# Patient Record
Sex: Male | Born: 1935 | Race: White | Hispanic: No | Marital: Married | Smoking: Never smoker
Health system: Southern US, Community
[De-identification: ages and names within clinical notes are randomized; demographics above are authoritative.]

## PROBLEM LIST (undated history)

## (undated) DIAGNOSIS — K922 Gastrointestinal hemorrhage, unspecified: Secondary | ICD-10-CM

## (undated) DIAGNOSIS — I340 Nonrheumatic mitral (valve) insufficiency: Secondary | ICD-10-CM

## (undated) DIAGNOSIS — I10 Essential (primary) hypertension: Secondary | ICD-10-CM

## (undated) DIAGNOSIS — F0390 Unspecified dementia without behavioral disturbance: Secondary | ICD-10-CM

## (undated) HISTORY — PX: BYPASS GRAFT: SHX909

## (undated) HISTORY — PX: FETAL SURGERY FOR CONGENITAL HERNIA: SHX1618

---

## 2004-02-10 ENCOUNTER — Other Ambulatory Visit: Payer: Self-pay

## 2005-04-27 ENCOUNTER — Ambulatory Visit: Payer: Self-pay | Admitting: Specialist

## 2005-08-16 ENCOUNTER — Ambulatory Visit: Payer: Self-pay | Admitting: Cardiology

## 2005-09-06 ENCOUNTER — Encounter: Payer: Self-pay | Admitting: Cardiology

## 2005-09-09 ENCOUNTER — Encounter: Payer: Self-pay | Admitting: Cardiology

## 2005-10-10 ENCOUNTER — Encounter: Payer: Self-pay | Admitting: Cardiology

## 2005-11-10 ENCOUNTER — Encounter: Payer: Self-pay | Admitting: Cardiology

## 2005-12-08 ENCOUNTER — Encounter: Payer: Self-pay | Admitting: Cardiology

## 2006-01-10 ENCOUNTER — Encounter: Payer: Self-pay | Admitting: Specialist

## 2006-02-07 ENCOUNTER — Encounter: Payer: Self-pay | Admitting: Specialist

## 2006-03-10 ENCOUNTER — Encounter: Payer: Self-pay | Admitting: Specialist

## 2006-04-11 ENCOUNTER — Ambulatory Visit: Payer: Self-pay | Admitting: Specialist

## 2006-04-13 ENCOUNTER — Encounter: Payer: Self-pay | Admitting: Specialist

## 2006-04-27 ENCOUNTER — Encounter: Payer: Self-pay | Admitting: Specialist

## 2006-05-11 ENCOUNTER — Encounter: Payer: Self-pay | Admitting: Specialist

## 2006-05-16 ENCOUNTER — Encounter: Payer: Self-pay | Admitting: Specialist

## 2006-06-13 ENCOUNTER — Encounter: Payer: Self-pay | Admitting: Specialist

## 2006-06-15 ENCOUNTER — Encounter: Payer: Self-pay | Admitting: Specialist

## 2006-07-10 ENCOUNTER — Encounter: Payer: Self-pay | Admitting: Specialist

## 2006-08-10 ENCOUNTER — Encounter: Payer: Self-pay | Admitting: Specialist

## 2006-09-21 ENCOUNTER — Encounter: Payer: Self-pay | Admitting: Specialist

## 2006-09-28 ENCOUNTER — Encounter: Payer: Self-pay | Admitting: Specialist

## 2006-10-10 ENCOUNTER — Encounter: Payer: Self-pay | Admitting: Specialist

## 2006-10-12 ENCOUNTER — Encounter: Payer: Self-pay | Admitting: Specialist

## 2006-11-14 ENCOUNTER — Encounter: Payer: Self-pay | Admitting: Specialist

## 2006-11-16 ENCOUNTER — Encounter: Payer: Self-pay | Admitting: Specialist

## 2006-12-12 ENCOUNTER — Encounter: Payer: Self-pay | Admitting: Specialist

## 2006-12-14 ENCOUNTER — Encounter: Payer: Self-pay | Admitting: Specialist

## 2007-01-09 ENCOUNTER — Encounter: Payer: Self-pay | Admitting: Specialist

## 2007-02-08 ENCOUNTER — Encounter: Payer: Self-pay | Admitting: Specialist

## 2008-04-23 ENCOUNTER — Ambulatory Visit: Payer: Self-pay | Admitting: Specialist

## 2008-05-12 ENCOUNTER — Ambulatory Visit: Payer: Self-pay | Admitting: General Surgery

## 2008-05-28 ENCOUNTER — Ambulatory Visit: Payer: Self-pay | Admitting: General Surgery

## 2008-05-28 ENCOUNTER — Other Ambulatory Visit: Payer: Self-pay

## 2008-06-04 ENCOUNTER — Ambulatory Visit: Payer: Self-pay | Admitting: General Surgery

## 2008-06-11 ENCOUNTER — Other Ambulatory Visit: Payer: Self-pay

## 2008-06-11 ENCOUNTER — Inpatient Hospital Stay: Payer: Self-pay | Admitting: Specialist

## 2008-06-19 ENCOUNTER — Ambulatory Visit: Payer: Self-pay | Admitting: Cardiology

## 2008-10-13 ENCOUNTER — Inpatient Hospital Stay: Payer: Self-pay | Admitting: Specialist

## 2009-02-26 ENCOUNTER — Ambulatory Visit: Payer: Self-pay | Admitting: Nephrology

## 2010-05-07 ENCOUNTER — Ambulatory Visit: Payer: Self-pay | Admitting: Ophthalmology

## 2010-05-17 ENCOUNTER — Ambulatory Visit: Payer: Self-pay | Admitting: Ophthalmology

## 2010-08-06 ENCOUNTER — Ambulatory Visit: Payer: Self-pay | Admitting: Ophthalmology

## 2010-08-16 ENCOUNTER — Ambulatory Visit: Payer: Self-pay | Admitting: Ophthalmology

## 2010-11-02 ENCOUNTER — Emergency Department: Payer: Self-pay | Admitting: Internal Medicine

## 2010-11-04 ENCOUNTER — Ambulatory Visit: Payer: Self-pay | Admitting: Specialist

## 2011-03-18 ENCOUNTER — Inpatient Hospital Stay: Payer: Self-pay | Admitting: Specialist

## 2011-09-07 ENCOUNTER — Emergency Department: Payer: Self-pay

## 2011-09-13 ENCOUNTER — Ambulatory Visit: Payer: Self-pay | Admitting: General Surgery

## 2011-09-19 ENCOUNTER — Ambulatory Visit: Payer: Self-pay | Admitting: General Surgery

## 2011-09-30 ENCOUNTER — Inpatient Hospital Stay: Payer: Self-pay | Admitting: Internal Medicine

## 2011-12-27 ENCOUNTER — Emergency Department: Payer: Self-pay | Admitting: Emergency Medicine

## 2012-06-14 ENCOUNTER — Emergency Department: Payer: Self-pay | Admitting: Emergency Medicine

## 2012-06-14 LAB — BASIC METABOLIC PANEL
Anion Gap: 8 (ref 7–16)
BUN: 22 mg/dL — ABNORMAL HIGH (ref 7–18)
Co2: 26 mmol/L (ref 21–32)
Creatinine: 1.31 mg/dL — ABNORMAL HIGH (ref 0.60–1.30)
Glucose: 91 mg/dL (ref 65–99)
Potassium: 3.3 mmol/L — ABNORMAL LOW (ref 3.5–5.1)
Sodium: 140 mmol/L (ref 136–145)

## 2012-06-14 LAB — CK TOTAL AND CKMB (NOT AT ARMC)
CK, Total: 71 U/L (ref 35–232)
CK-MB: 1 ng/mL (ref 0.5–3.6)

## 2012-06-14 LAB — CBC
HCT: 39.2 % — ABNORMAL LOW (ref 40.0–52.0)
HGB: 13.4 g/dL (ref 13.0–18.0)
Platelet: 184 10*3/uL (ref 150–440)
RBC: 4.28 10*6/uL — ABNORMAL LOW (ref 4.40–5.90)
WBC: 7 10*3/uL (ref 3.8–10.6)

## 2012-06-14 LAB — TROPONIN I: Troponin-I: <0.02 ng/mL

## 2013-03-06 LAB — CBC
HCT: 36.9 % — ABNORMAL LOW (ref 40.0–52.0)
MCHC: 34.2 g/dL (ref 32.0–36.0)
Platelet: 186 10*3/uL (ref 150–440)

## 2013-03-07 ENCOUNTER — Inpatient Hospital Stay: Payer: Self-pay | Admitting: Internal Medicine

## 2013-03-07 LAB — COMPREHENSIVE METABOLIC PANEL
Albumin: 3.4 g/dL (ref 3.4–5.0)
Alkaline Phosphatase: 61 U/L (ref 50–136)
Anion Gap: 7 (ref 7–16)
BUN: 28 mg/dL — ABNORMAL HIGH (ref 7–18)
Bilirubin,Total: 0.4 mg/dL (ref 0.2–1.0)
Chloride: 108 mmol/L — ABNORMAL HIGH (ref 98–107)
Co2: 25 mmol/L (ref 21–32)
Creatinine: 1.32 mg/dL — ABNORMAL HIGH (ref 0.60–1.30)
EGFR (African American): 56 — ABNORMAL LOW
EGFR (Non-African Amer.): 49 — ABNORMAL LOW
Glucose: 100 mg/dL — ABNORMAL HIGH (ref 65–99)
Osmolality: 285 (ref 275–301)
Potassium: 4 mmol/L (ref 3.5–5.1)
SGOT(AST): 32 U/L (ref 15–37)
SGPT (ALT): 16 U/L (ref 12–78)
Sodium: 140 mmol/L (ref 136–145)
Total Protein: 7 g/dL (ref 6.4–8.2)

## 2013-03-07 LAB — CBC
HCT: 31.2 % — ABNORMAL LOW (ref 40.0–52.0)
HGB: 10.7 g/dL — ABNORMAL LOW (ref 13.0–18.0)
MCHC: 34.4 g/dL (ref 32.0–36.0)
MCV: 93 fL (ref 80–100)
RBC: 3.34 10*6/uL — ABNORMAL LOW (ref 4.40–5.90)

## 2013-03-07 LAB — HEMOGLOBIN: HGB: 10.1 g/dL — ABNORMAL LOW (ref 13.0–18.0)

## 2013-03-08 LAB — CBC WITH DIFFERENTIAL/PLATELET
Basophil %: 0.5 %
Eosinophil #: 0 10*3/uL (ref 0.0–0.7)
Eosinophil %: 0 %
HCT: 28 % — ABNORMAL LOW (ref 40.0–52.0)
Lymphocyte #: 0.5 10*3/uL — ABNORMAL LOW (ref 1.0–3.6)
MCH: 32.2 pg (ref 26.0–34.0)
MCHC: 34.6 g/dL (ref 32.0–36.0)
MCV: 93 fL (ref 80–100)
Monocyte #: 0.3 x10 3/mm (ref 0.2–1.0)
Neutrophil %: 89.6 %
WBC: 7.5 10*3/uL (ref 3.8–10.6)

## 2013-03-08 LAB — HEMOGLOBIN: HGB: 9.7 g/dL — ABNORMAL LOW (ref 13.0–18.0)

## 2013-03-09 LAB — BASIC METABOLIC PANEL
Anion Gap: 9 (ref 7–16)
Chloride: 108 mmol/L — ABNORMAL HIGH (ref 98–107)
EGFR (Non-African Amer.): 59 — ABNORMAL LOW
Glucose: 117 mg/dL — ABNORMAL HIGH (ref 65–99)
Potassium: 3.4 mmol/L — ABNORMAL LOW (ref 3.5–5.1)

## 2013-03-09 LAB — MAGNESIUM: Magnesium: 1.6 mg/dL — ABNORMAL LOW

## 2013-06-28 ENCOUNTER — Emergency Department: Payer: Self-pay | Admitting: Emergency Medicine

## 2013-06-28 LAB — URINALYSIS, COMPLETE
Blood: NEGATIVE
Nitrite: NEGATIVE
Protein: >=500
RBC,UR: 1 /HPF (ref 0–5)
Squamous Epithelial: NONE SEEN

## 2013-06-28 LAB — COMPREHENSIVE METABOLIC PANEL
Alkaline Phosphatase: 62 U/L (ref 50–136)
Calcium, Total: 9.2 mg/dL (ref 8.5–10.1)
Co2: 28 mmol/L (ref 21–32)
Potassium: 3.2 mmol/L — ABNORMAL LOW (ref 3.5–5.1)
SGPT (ALT): 15 U/L (ref 12–78)

## 2013-06-28 LAB — CBC
HGB: 10.7 g/dL — ABNORMAL LOW (ref 13.0–18.0)
MCH: 26.4 pg (ref 26.0–34.0)
MCHC: 32.4 g/dL (ref 32.0–36.0)
MCV: 82 fL (ref 80–100)
Platelet: 194 10*3/uL (ref 150–440)
RBC: 4.07 10*6/uL — ABNORMAL LOW (ref 4.40–5.90)
RDW: 17.3 % — ABNORMAL HIGH (ref 11.5–14.5)

## 2013-06-28 LAB — TROPONIN I: Troponin-I: <0.02 ng/mL

## 2013-09-21 ENCOUNTER — Emergency Department: Payer: Self-pay | Admitting: Emergency Medicine

## 2013-09-21 LAB — COMPREHENSIVE METABOLIC PANEL
Albumin: 3.7 g/dL (ref 3.4–5.0)
Anion Gap: 6 — ABNORMAL LOW (ref 7–16)
Bilirubin,Total: 0.4 mg/dL (ref 0.2–1.0)
Calcium, Total: 9.3 mg/dL (ref 8.5–10.1)
Chloride: 107 mmol/L (ref 98–107)
Osmolality: 284 (ref 275–301)
Potassium: 3.5 mmol/L (ref 3.5–5.1)
Total Protein: 7.6 g/dL (ref 6.4–8.2)

## 2013-09-21 LAB — CBC
MCH: 27.1 pg (ref 26.0–34.0)
MCHC: 32.3 g/dL (ref 32.0–36.0)
MCV: 84 fL (ref 80–100)
Platelet: 237 10*3/uL (ref 150–440)
RBC: 4.13 10*6/uL — ABNORMAL LOW (ref 4.40–5.90)
WBC: 7.2 10*3/uL (ref 3.8–10.6)

## 2014-04-14 ENCOUNTER — Emergency Department: Payer: Self-pay | Admitting: Emergency Medicine

## 2014-04-14 LAB — COMPREHENSIVE METABOLIC PANEL
ALBUMIN: 3.5 g/dL (ref 3.4–5.0)
Alkaline Phosphatase: 69 U/L
Anion Gap: 8 (ref 7–16)
BUN: 26 mg/dL — AB (ref 7–18)
Bilirubin,Total: 0.8 mg/dL (ref 0.2–1.0)
CALCIUM: 8.9 mg/dL (ref 8.5–10.1)
CO2: 25 mmol/L (ref 21–32)
Chloride: 110 mmol/L — ABNORMAL HIGH (ref 98–107)
Creatinine: 1.43 mg/dL — ABNORMAL HIGH (ref 0.60–1.30)
EGFR (Non-African Amer.): 44 — ABNORMAL LOW
GFR CALC AF AMER: 51 — AB
Glucose: 90 mg/dL (ref 65–99)
Osmolality: 289 (ref 275–301)
Potassium: 3.8 mmol/L (ref 3.5–5.1)
SGOT(AST): 25 U/L (ref 15–37)
SGPT (ALT): 15 U/L (ref 12–78)
Sodium: 143 mmol/L (ref 136–145)
TOTAL PROTEIN: 7.7 g/dL (ref 6.4–8.2)

## 2014-04-14 LAB — CBC
HCT: 37.8 % — ABNORMAL LOW (ref 40.0–52.0)
HGB: 12.2 g/dL — ABNORMAL LOW (ref 13.0–18.0)
MCH: 29.5 pg (ref 26.0–34.0)
MCHC: 32.3 g/dL (ref 32.0–36.0)
MCV: 91 fL (ref 80–100)
PLATELETS: 210 10*3/uL (ref 150–440)
RBC: 4.14 10*6/uL — ABNORMAL LOW (ref 4.40–5.90)
RDW: 16.9 % — AB (ref 11.5–14.5)
WBC: 9.3 10*3/uL (ref 3.8–10.6)

## 2014-04-14 LAB — TROPONIN I: Troponin-I: <0.02 ng/mL

## 2014-10-01 LAB — URINALYSIS, COMPLETE
BACTERIA: NONE SEEN
BLOOD: NEGATIVE
Bilirubin,UR: NEGATIVE
GLUCOSE, UR: NEGATIVE mg/dL (ref 0–75)
LEUKOCYTE ESTERASE: NEGATIVE
NITRITE: NEGATIVE
PH: 7 (ref 4.5–8.0)
Protein: 100
Renal Epithelial: 13
Specific Gravity: 1.013 (ref 1.003–1.030)
Squamous Epithelial: NONE SEEN
WBC UR: <1 /HPF (ref 0–5)

## 2014-10-01 LAB — BASIC METABOLIC PANEL
Anion Gap: 11 (ref 7–16)
BUN: 22 mg/dL — AB (ref 7–18)
CALCIUM: 9.1 mg/dL (ref 8.5–10.1)
CHLORIDE: 107 mmol/L (ref 98–107)
Co2: 24 mmol/L (ref 21–32)
Creatinine: 1.36 mg/dL — ABNORMAL HIGH (ref 0.60–1.30)
EGFR (Non-African Amer.): 53 — ABNORMAL LOW
Glucose: 157 mg/dL — ABNORMAL HIGH (ref 65–99)
OSMOLALITY: 290 (ref 275–301)
Potassium: 2.9 mmol/L — ABNORMAL LOW (ref 3.5–5.1)
Sodium: 142 mmol/L (ref 136–145)

## 2014-10-01 LAB — TROPONIN I: Troponin-I: <0.02 ng/mL

## 2014-10-01 LAB — CBC
HCT: 46 % (ref 40.0–52.0)
HGB: 14.8 g/dL (ref 13.0–18.0)
MCH: 30.5 pg (ref 26.0–34.0)
MCHC: 32.3 g/dL (ref 32.0–36.0)
MCV: 94 fL (ref 80–100)
Platelet: 225 10*3/uL (ref 150–440)
RBC: 4.87 10*6/uL (ref 4.40–5.90)
RDW: 15.9 % — AB (ref 11.5–14.5)
WBC: 10.5 10*3/uL (ref 3.8–10.6)

## 2014-10-02 ENCOUNTER — Inpatient Hospital Stay: Payer: Self-pay | Admitting: Internal Medicine

## 2014-10-02 LAB — BASIC METABOLIC PANEL
ANION GAP: 10 (ref 7–16)
BUN: 23 mg/dL — ABNORMAL HIGH (ref 7–18)
Calcium, Total: 8.9 mg/dL (ref 8.5–10.1)
Chloride: 107 mmol/L (ref 98–107)
Co2: 26 mmol/L (ref 21–32)
Creatinine: 1.46 mg/dL — ABNORMAL HIGH (ref 0.60–1.30)
EGFR (African American): 59 — ABNORMAL LOW
EGFR (Non-African Amer.): 48 — ABNORMAL LOW
GLUCOSE: 117 mg/dL — AB (ref 65–99)
Osmolality: 290 (ref 275–301)
Potassium: 3.4 mmol/L — ABNORMAL LOW (ref 3.5–5.1)
Sodium: 143 mmol/L (ref 136–145)

## 2014-10-02 LAB — CK-MB
CK-MB: 1.4 ng/mL (ref 0.5–3.6)
CK-MB: 2.1 ng/mL (ref 0.5–3.6)
CK-MB: 2.7 ng/mL (ref 0.5–3.6)

## 2014-10-02 LAB — MAGNESIUM: MAGNESIUM: 2 mg/dL

## 2014-10-02 LAB — HEMOGLOBIN A1C: HEMOGLOBIN A1C: 5.6 % (ref 4.2–6.3)

## 2014-10-03 LAB — COMPREHENSIVE METABOLIC PANEL
ALBUMIN: 3.5 g/dL (ref 3.4–5.0)
ALT: 15 U/L
AST: 30 U/L (ref 15–37)
Alkaline Phosphatase: 78 U/L
Anion Gap: 10 (ref 7–16)
BILIRUBIN TOTAL: 0.8 mg/dL (ref 0.2–1.0)
BUN: 27 mg/dL — ABNORMAL HIGH (ref 7–18)
CALCIUM: 9 mg/dL (ref 8.5–10.1)
CHLORIDE: 106 mmol/L (ref 98–107)
Co2: 27 mmol/L (ref 21–32)
Creatinine: 1.66 mg/dL — ABNORMAL HIGH (ref 0.60–1.30)
EGFR (African American): 51 — ABNORMAL LOW
GFR CALC NON AF AMER: 42 — AB
GLUCOSE: 116 mg/dL — AB (ref 65–99)
OSMOLALITY: 291 (ref 275–301)
Potassium: 3.2 mmol/L — ABNORMAL LOW (ref 3.5–5.1)
SODIUM: 143 mmol/L (ref 136–145)
TOTAL PROTEIN: 7.4 g/dL (ref 6.4–8.2)

## 2015-01-30 NOTE — Consult Note (Signed)
Brief Consult Note: Diagnosis: Recurrent lower GI bleed.   Patient was seen by consultant.   Consult note dictated.   Comments: Mr. Gregory Oconnor is a pleasant 81 y/o male admitted with a large episode of bright red blood with dark clots.  Hgb 10.1.  No transfusion required at this time.  No further blleeding since hospitalization.Hx of diverticular bleeding requiring multiple transfusions in Dec 2012 & colonoscopy showed extensive diverticulosis by Dr Bluford Kaufmannh.  I have discussed his care with Dr Servando SnareWohl.    If he were to rebleed, would suggest bleeding scan.   If positive for diverticular bleed, would consult vascular surgeon for possible angiography & embolization.  At this point, would continue supportive measures.   Plann: 1) Follow-up with renal CT given abnormal right kidney lesion per attending 2) if rebleeds, bleeding scan.  Followed by vascular surgery consult if diverticular. 3) continue IV fluids, supportive measures and rest #[id number].  Electronic Signatures: Joselyn ArrowJones, Rosaleigh Brazzel L (NP)  (Signed 29-May-14 10:53)  Authored: Brief Consult Note   Last Updated: 29-May-14 10:53 by Joselyn ArrowJones, Tomeka Kantner L (NP)

## 2015-01-30 NOTE — Consult Note (Signed)
PATIENT NAME:  Gregory Oconnor, Gregory Oconnor MR#:  [id number] DATE OF BIRTH:  April 20, 1924  DATE OF CONSULTATION:  03/07/2013  REFERRING PHYSICIAN:  Dr. Heron NayVasireddy  PRIMARY CARE PHYSICIAN: Dr. Leavy CellaBlocker.  GASTROENTEROLOGIST: Dr. Servando SnareWohl.   REASON FOR CONSULTATION: Lower GI bleed.  HISTORY OF PRESENT ILLNESS: Gregory Oconnor is an 81 year old Caucasian male with multiple medical problems including advanced dementia, hypertension, coronary artery disease and diverticulosis, who presents with large volume hematochezia that started last night. His wife reports around 9:00 p.m. he went to the bathroom and had a large amount of bright red blood with a small amount of stool and black blood clots. He has had no further bleeding since that episode. He denies any abdominal pain. He does have advanced dementia and his wife gives most of the history. She reports he has had no complaints of abdominal pain except rare complains of heartburn. She states that he was in his usual state of health until this episode yesterday and has had no further bleeding since hospitalization. He has not had any nausea or vomiting. He did have a lower GI bleed back in December 2012. He had a colonoscopy by Dr. Bluford Kaufmannh that showed extensive diverticulosis throughout the colon. He required multiple transfusions for suspected diverticular bleed at that time. His hemoglobin was 12.6 upon admission and is down to 10.1 today with IV fluids. He is on aspirin daily at home. His creatinine is 1.32. He had a CT of abdomen and pelvis with contrast on 03/07/2013, which showed sigmoid diverticulosis and a small right inguinal hernia with a contained loop of bowel and a small right renal lesion that has increased in size. His wife denies any over-the-counter NSAIDs.   PAST MEDICAL/SURGICAL HISTORY: Diverticulosis with diverticular bleed December 2012; last colonoscopy as per HPI, advanced dementia, hypertension, BPH, coronary artery disease, status post CABG, status post  pacemaker placement and multiple hernia repairs.   MEDICATIONS PRIOR TO ADMISSION: Amlodipine 5 mg daily, Aricept 10 mg at bedtime, aspirin 81 mg daily, Avodart 0.5 mg Monday, Wednesday, Friday, Claritin 10 mg daily, gabapentin 100 mg p.r.n., isosorbide 120 mg extended release daily, lisinopril 5 mg daily, Nexium 20 mg daily, nitroglycerin 0.4 mg sublingual p.r.n., vitamin B12 1000 mcg daily.   ALLERGIES: CEPHALEXIN, PENICILLIN AND CLARITHROMYCIN.   FAMILY HISTORY: There is no known family history of colorectal carcinoma reported from wife. However, there is some notation in the record that there was a positive family history of colon cancer. I am not getting this report today. Otherwise noncontributory.   SOCIAL HISTORY: No recent tobacco, alcohol or drug use. He is married. He lives with his wife. He is a retired Airline pilotaccountant.   REVIEW OF SYSTEMS: Unable to obtain from patient. Most of the information is obtained from his wife. See history of present illness, otherwise negative review of systems.   PHYSICAL EXAMINATION:  VITAL SIGNS: Temperature 97.4, pulse 68, respirations 28, blood pressure 128/83, oxygen saturation 94% on room air.  GENERAL: He is an elderly, pale-appearing, Caucasian male who is alert, but disoriented and cooperative. He wife is at the bedside.  HEENT: Sclerae clear and anicteric. Conjunctivae pink. Oropharynx pink and moist without any lesions.  NECK: Supple without any mass or thyromegaly.  CHEST: Heart regular rate and rhythm. Normal S1, S2, without murmurs, clicks, rubs or gallops.  LUNGS: Clear to auscultation bilaterally.  ABDOMEN: Positive bowel sounds x 4. No bruits auscultated. Abdomen is soft, nontender, nondistended without palpable mass or splenomegaly. No tenderness or guarding.  EXTREMITIES: Without  edema bilaterally.  RECTAL: Deferred.  SKIN: No rash or lesions. No jaundice.  NEUROLOGIC: He is alert, but disoriented x 2.  PSYCHIATRIC: He is cooperative.  Normal mood and affect.   LABORATORY STUDIES: 03/06/2013, glucose 100, BUN 28, creatinine 1.32, chloride 108, otherwise normal BMP. LFTs normal. White blood cell count 8.3, platelets 170.   IMPRESSION: Gregory Oconnor is a pleasant 81 year old male admitted with a large episode of bright red blood with dark clots last night. His hemoglobin is 10.1. No transfusion required at this time. He has had no further bleeding since hospitalization. He has a history of diverticular bleeding requiring multiple transfusions December 2012 and colonoscopy showed extensive diverticulosis by Dr. Bluford Kaufmann. I have discussed his care with Dr. Servando Snare. If he were to re-bleed, would suggest bleeding scan. If positive for diverticular bleed, would consult vascular surgeon for possible angiography and embolization. At this point, would continue supportive measures.   PLAN:  1.  Follow up with renal CT given abnormal right kidney lesion per attending.  2.  If re-bleeds, bleeding scan followed by vascular surgery consult.  3.  Continue IV fluids, supportive measures and rest.   ____________________________ Joselyn Arrow, NP klj:aw D: 03/07/2013 10:49:03 ET T: 03/07/2013 11:02:32 ET JOB#: [id number]  cc: Joselyn Arrow, NP, <Dictator> Rosalyn Gess. Blocker, MD Joselyn Arrow FNP ELECTRONICALLY SIGNED 03/13/2013 14:08

## 2015-01-30 NOTE — Consult Note (Signed)
Chief Complaint:  Subjective/Chief Complaint No further bleeding per RN.  Pt unable to answer questions.  Pleasantly confused.   VITAL SIGNS/ANCILLARY NOTES: **Vital Signs.:   30-May-14 08:19  Vital Signs Type Routine  Temperature Temperature (F) 98.9  Celsius 37.1  Temperature Source axillary    10:30  Pulse Pulse 74  Respirations Respirations 19  Systolic BP Systolic BP 154  Diastolic BP (mmHg) Diastolic BP (mmHg) 83  Mean BP 106  Pulse Ox % Pulse Ox % 96  Pulse Ox Activity Level  At rest  Oxygen Delivery Room Air/ 21 %   Brief Assessment:  GEN well developed, well nourished, no acute distress, Pale   Cardiac Regular   Respiratory normal resp effort   Gastrointestinal Normal   Gastrointestinal details normal Soft  Nondistended  No masses palpable  Bowel sounds normal   EXTR negative edema   Additional Physical Exam Skin: Pale, warm,dry   Lab Results: Routine Hem:  30-May-14 00:49   WBC (CBC) 7.5  RBC (CBC)  3.00  Hemoglobin (CBC)  9.7  Hematocrit (CBC)  28.0  Platelet Count (CBC) 156  MCV 93  MCH 32.2  MCHC 34.6  RDW 14.3  Neutrophil % 89.6  Lymphocyte % 6.4  Monocyte % 3.5  Eosinophil % 0.0  Basophil % 0.5  Neutrophil #  6.7  Lymphocyte #  0.5  Monocyte # 0.3  Eosinophil # 0.0  Basophil # 0.0 (Result(s) reported on 08 Mar 2013 at 01:16AM.)   Assessment/Plan:  Assessment/Plan:  Assessment Large volume Hematochezia:  No further bleeding.  Likely diverticular bleeding Anemia: Hgb minimally trending down, will follow.   Plan 1) if rebleeds, bleeding scan.  Followed by vascular surgery consult if diverticular. 2) continue IV fluids, supportive measures and rest   Electronic Signatures: Joselyn ArrowJones, Tiny Chaudhary L (NP)  (Signed 30-May-14 12:08)  Authored: Chief Complaint, VITAL SIGNS/ANCILLARY NOTES, Brief Assessment, Lab Results, Assessment/Plan   Last Updated: 30-May-14 12:08 by Joselyn ArrowJones, Tove Wideman L (NP)

## 2015-01-30 NOTE — H&P (Signed)
PATIENT NAME:  Gregory Oconnor, Gregory Oconnor MR#:  [id number] DATE OF BIRTH:  03/26/1924  DATE OF ADMISSION:  03/07/2013  PRIMARY CARE PHYSICIAN:  Dr. Leavy CellaBlocker.   REFERRING PHYSICIAN:  Bayard Malesandolph Brown, M.D.   CHIEF COMPLAINT:  Bright red blood per rectum.   HISTORY OF PRESENT ILLNESS:  Mr. Gregory Oconnor is an 81 year old with advanced dementia, diverticulosis, hypertension with a previous history of GI bleed from diverticulosis, comes to the Emergency Department with complaints of bright red blood per rectum around 9:00 p.m.  The patient is doing well, in his usual state of health.  Had a large bowel movement with blood all over the bathroom toilet and his clothes.  Considering this, the patient was brought to the Emergency Department.  After coming to the Emergency Department, the patient had another episode of bowel movement.  The patient's initial hemoglobin was 12.6, however after having the second bowel movement as well as giving the IV fluids, the patient's dropped to 10.7.  The patient was initially tachycardic, improved with IV fluids.  Denied having any abdominal pain.  The patient was admitted in 2012 with a similar condition.  At that time, the patient had a colonoscopy done which revealed multiple small and large open-mouthed diverticula.  The patient continued to drop hemoglobin during that time, required multiple transfusions.   PAST MEDICAL HISTORY: 1.  Advanced dementia.  2.  Hypertension.  3.  Diverticulosis with previous history of bleed.  4.  Benign prostatic hypertrophy.  5.  Coronary artery disease, status post CABG.  6.  Status post pacemaker placement. 7.  History of hernia repair.  ALLERGIES:  1.  CLARITHROMYCIN.  2.  CEPHALEXIN.  3.  PENICILLIN.   HOME MEDICATIONS:  1.  Vitamin B 12,000 mcg once a day.  2.  Nitroglycerin 0.4  3.  Lisinopril 5 mg once a day.  4.  Imdur 120 mg 1 tablet once a day.  5.  Gabapentin 100 mg as needed.  6.  Claritin 10 mg once a day.  7.  Avodart  0.5 mg Monday, Wednesday, Friday.  8.  Aspirin 81 mg daily.  9.   10 mg once a day.  10.  Amlodipine 5 mg once a day.   SOCIAL HISTORY:  No history of smoking, drinking alcohol or using illicit drugs.  Married, lives with his wife, who have been married for the last 14 years.   FAMILY HISTORY:  Noncontributory, however the previous records indicate that positive for malignancy.   REVIEW OF SYSTEMS:  Could not be obtained from the patient secondary to patient's advanced dementia.   PHYSICAL EXAMINATION: GENERAL:  This is a well-built, well-nourished, age-appropriate male lying down in the bed, confused.  VITAL SIGNS:  Temperature 98.1, pulse 82, blood pressure 142/93, respiratory rate of 18, oxygen saturations 95% on room air.  HEENT:  Head normocephalic, atraumatic.  Eyes, no sclerae icterus.  Conjunctivae normal.  Pupils equal and react to light.  Extraocular movements are intact.  Mucous membranes moist.  No pharyngeal erythema.  NECK:  Supple.  No lymphadenopathy.  No JVD.  No carotid bruit.  CHEST:  Has no focal tenderness.  LUNGS:  Bilaterally clear to auscultation.  HEART:  S1, S2, regular, no murmurs are heard.  No pedal edema.  Pulses 2+ in posterior tibialis.  ABDOMEN:  Bowel sounds plus.  Soft, nontender, nondistended.  No hepatosplenomegaly.  SKIN:  No rash or lesions.   RECTAL:  Stool occult guaiac-positive, strongly positive.  LYMPHATIC:  No inguinal and  axillary lymphadenopathy.  MUSCULOSKELETAL:  Good range of motion in all the extremities.  NEUROLOGIC:  The patient is alert, oriented to self and wife, but not to place and time.  No apparent cranial nerve abnormalities.  Moving all four extremities.  Per patient's wife, patient is confused at baseline, recognizes only the family members whom he sees on a regular basis.   LABORATORY DATA:  CBC, WBC of 8.3, initial hemoglobin was 12.6, tended down to 10.7.  CMP, BUN 28, creatinine of 1.8.  The rest all the values are within  normal limits.   ASSESSMENT AND PLAN:  Gregory Oconnor is an 81 year old with known history of diverticulosis, comes to the Emergency Department with bright red blood per rectum.  1.  Bright red blood per rectum.  This is unlikely upper gastrointestinal bleed.  The patient is hemodynamically stable.  We will admit the patient to the stepdown unit of the CCU.  Continue to follow up with vital signs.  We will continue with IV fluids.  Type and crossmatch 2 units of packed RBC.  We will transfuse for hemoglobin less than 8 or if patient continues to have bloody bowel movements.  We will hold the aspirin.  2.  Hypertension.  Currently well-controlled.  We will hold all other blood pressure medications except for Imdur and lisinopril.  3.  Anemia secondary to acute blood loss.  We will continue to follow up.  4.  Advanced dementia.  We will hold all medications.  5.  Keep the patient on deep vein thrombosis prophylaxis with SCDs.  Time spent 40 min.   ____________________________ Susa Griffins, MD pv:ea D: 03/07/2013 16:10:96 ET T: 03/07/2013 06:56:51 ET JOB#: [id number]  cc: Susa Griffins, MD, <Dictator> Susa Griffins MD ELECTRONICALLY SIGNED 03/08/2013 7:33

## 2015-01-30 NOTE — Discharge Summary (Signed)
PATIENT NAME:  Gregory Oconnor, Gregory Oconnor MR#:  [id number] DATE OF BIRTH:  [birth year]  DATE OF ADMISSION:  03/07/2013 DATE OF DISCHARGE:  03/10/2013  PRESENTING COMPLAINT: Rectal bleed.  DISCHARGE DIAGNOSIS:  1.  Lower gastrointestinal bleed, suspect diverticular, resolved.  2.  Anemia due to gastrointestinal bleed.  3.  Advanced dementia.  4.  Hypertension.   CODE STATUS: Full code.   MEDICATIONS:  1.  Imdur 120 mg extended release p.o. daily.  2.  Aricept 10 mg at bedtime.  3.  Amlodipine 5 mg daily.  4.  Avodart 0.5 mg Monday, Wednesday, Friday.  5.  Vitamin B12 1000 mcg p.o. daily.  6.  Nexium 20 mg delayed release p.o. daily.  7.  Claritin 10 mg daily.  8.  Lisinopril 5 mg daily.  9.  Aspirin 81 mg daily.  10. Gabapentin  mg daily as needed.  11. Haldol 1 mg 1 time a day as needed for agitation.   DIET: High fiber diet.   FOLLOWUP: With Dr. Leavy CellaBlocker your primary care physician and follow up with Dr. Daleen SquibbWall, GI as needed.   Hemoglobin at discharge was 9.6, creatinine is 1.12, sodium 143, potassium 3.4, chloride 108 and bicarbonate is 26. CT of the abdomen and pelvis showed no acute findings in the abdomen or pelvis. There is diverticulosis of the sigmoid colon and small right inguinal hernia containing a loop of bowel. GI consultation with Dr. Daleen SquibbWall.   BRIEF SUMMARY OF HOSPITAL COURSE: Mr. Gregory Oconnor is an 81 year old Caucasian gentleman with known history of diverticulosis with previous history of diverticular bleed, hypertension, CAD,  status post CABG and advanced dementia, who comes in with bright red blood per rectum. He was admitted with:  1.  Hematochezia/lower GI bleed, which was suspected to be recurrent diverticular bleed. The patient was started on IV fluids and initially kept n.p.o. His hemoglobin remained stable. He did not require any blood transfusion. It dropped 2 grams from his previous hemoglobin and remained at 9.7 prior to discharge. Clear liquid diet was started and  advanced to a soft diet as tolerated. The patient was seen by Dr. Daleen SquibbWall and no further recommendations were given at this time. Wife was explained to follow up with Dr. Daleen SquibbWall as needed.  2.  Hypertension. The patient was placed on his home meds, which is ACE inhibitors, amlodipine and Imdur.  3.  Advanced dementia with intermittent agitation. P.r.n. Haldol was prescribed to take home.  4.  History of CAD, status post CABG. We did hold aspirin in the setting of GI bleed. The rest of his cardiac meds were continued. Hospital stay otherwise remained stable. The patient remained full code.   TIME SPENT: 40 minutes.   ____________________________ Wylie HailSona A. Allena KatzPatel, MD sap:aw D: 03/11/2013 06:45:06 ET T: 03/11/2013 09:02:56 ET JOB#: [id number]  cc: Kiari Hosmer A. Allena KatzPatel, MD, <Dictator> Rosalyn GessMichael E. Blocker, MD DR. WALL (2 IN SYSTEM, NO FIRST NAME GIVEN) Baldemar Dady A Chennel Olivos MD ELECTRONICALLY SIGNED 03/14/2013 19:37

## 2015-01-31 NOTE — H&P (Signed)
PATIENT NAME:  Gregory Oconnor, Gregory Oconnor MR#:  [id number] DATE OF BIRTH:  Aug 14, 1924  DATE OF ADMISSION:  10/02/2014  REFERRING DOCTOR: Lurena Joiner L. Shaune Pollack, MD  PRIMARY CARE DOCTOR: Stann Mainland. Sampson Goon, MD, of [facility].  CHIEF COMPLAINT: Altered sensorium with  generalized weakness and unresponsiveness with slumping to the side.    HISTORY OF PRESENT ILLNESS: An 80 year old Caucasian male with past medical history significant for Alzheimer dementia, history of hypertension, myocardial infarction status post CABG, status post permanent pacemaker, history of GI bleeds in the past, benign prostatic hypertrophy was in his usual state of health until last night when he was sitting on his couch, he was found to be with altered sensorium with decreased responsiveness, hence his wife called  EMS who found the patient with altered sensorium and was unresponsive on arrival and was also  noted to be slumped to one side with weakness and was brought to the Emergency Room by EMS for further evaluation. On arrival to the Emergency Room, the patient was still with altered sensorium with unresponsiveness but gradually his unresponsiveness improved and he became more alert and awake as time progressed in the Emergency Room. According to the patient's wife, who is sitting with the patient at this time, the patient was doing fine up until last night when he developed the above mentioned symptoms all of a sudden. The patient regained his responsiveness and, according to the patient's wife, he is at his baseline at the current time. There was no history of any chest pain, shortness of breath prior to the event. No history of any recent fever, cough, shortness of breath, palpitations. No history of any nausea, vomiting, diarrhea, or urinary symptoms, as per the patient's wife. In the Emergency Room, the patient was evaluated by the ED physician and his lab work was essentially unremarkable except for a potassium of 2.9 and BUN and  creatinine significant for 22/1.36. A chest x-ray was unremarkable for any acute pathology and CT of the head, noncontrast study, was also negative for any acute intracranial pathology. The hospitalist service was called and consulted for further evaluation and management. At the current time, the patient is alert, awake, and following some commands. Denies any complaints at this time and comfortably resting in the bed and moving all his extremities well.   PAST MEDICAL HISTORY:  1.  Alzheimer dementia.  2.  Hypertension.  3.  History of myocardial infarction status post CABG.  4.  Status post permanent pacemaker.  5.  History of gastrointestinal bleeds x 2.  6.  History of benign prostatic hypertrophy.  7.  History of skin cancer.    PAST SURGICAL HISTORY:  1.  Status post CABG.  2.  Hernia repair.   ALLERGIES: PENICILLIN, CLARITHROMYCIN, CEPHALEXIN.   HOME MEDICATIONS:  1.  Amlodipine 10 mg tablet 1 tablet orally once a day.  2.  Aricept 10 mg tablet 1 tablet orally once a day.  3.  Avodart 0.5 mg tablet 1 tablet orally once a day.  4.  Claritin 10 mg tablet 1 tablet orally once a day.  5.  Gabapentin 100 mg tablet 1 tablet orally once a day.  6.  Haloperidol 1 mg tablet 2 tablets orally 1-2 times a day as needed for agitation.  7.  Isosorbide mononitrate 120 mg extended-release tablet 1 tablet orally once a day.  8.  Nexium 20 mg oral powder delayed release 1 capsule once a day.  9.  Vitamin B12 at 1000 mcg tablet 1  tablet orally once a day.   SOCIAL HISTORY: He is married and lives with his wife at home. He is pretty much dependent on his wife for total care. He is ambulatory at home without any assistance. No history of any smoking, alcohol, or substance abuse.    FAMILY HISTORY: Daughter with a history of malignancy.    REVIEW OF SYSTEMS: Review of systems limited because the patient has advanced dementia. The review of systems I obtained is from the patient's wife who is with  the patient at this time.  CONSTITUTIONAL: Negative for fever. He was noted to have acute onset of altered sensorium with generalized weakness by his wife.  EYES: Negative for blurred vision or double vision.  EARS, NOSE, AND THROAT: Negative for tinnitus, ear pain.  RESPIRATORY: Negative for cough, wheezing, hemoptysis, painful respirations.  CARDIOVASCULAR: Negative for chest pain, palpitations, pedal edema.  GASTROINTESTINAL: Negative for nausea, vomiting, diarrhea, abdominal pain, hematemesis, melena, or GERD symptoms.  GENITOURINARY: Negative for dysuria, hematuria, or frequency.  ENDOCRINE: Negative for polyuria, nocturia.  HEMATOLOGIC: Negative for anemia, easy bruising or bleeding, or swollen glands.  INTEGUMENTARY: Negative for acne, skin rash, or lesions.  MUSCULOSKELETAL: Negative for arthritis, joint swelling.  NEUROLOGICAL: As mentioned in the history of present illness, sudden onset of altered sensorium with generalized weakness concerning for TIA or CVA.  PSYCHIATRIC: History of dementia with agitation p.r.n.   PHYSICAL EXAMINATION:  GENERAL: Well-developed, well-nourished male alert and awake, following some commands, not able to obtain any detailed history because the patient has dementia. In no acute distress, comfortably lying in the bed.   VITAL SIGNS: Temperature 97.5 degrees Fahrenheit; pulse rate 75 per minute; respirations 11 per minute; blood pressure on arrival 209/95, current blood pressure is 163/94; oxygen saturation 97% on room air.   EYES: Pupils are equal and reactive to light. No conjunctival pallor. No scleral icterus. Extraocular movements intact.  NOSE: No nasal lesions. No drainage.  EARS: No drainage. No external lesions.  ORAL CAVITY: No mucosal lesions. No exudates.  NECK: Supple. No JVD. No thyromegaly. No carotid bruit. Range of motion of neck normal.   RESPIRATORY: Bilateral vesicular breath sounds present. No rales or rhonchi.  CARDIOVASCULAR: S1,  S2 regular. No murmurs, gallops, or clicks appreciated. Peripheral pulses equal at carotid, femoral, and pedal pulses. No peripheral edema.  GASTROINTESTINAL: Abdomen is soft and nontender. No hepatosplenomegaly. Bowel sounds present and equal in all 4 quadrants. No rebound. No guarding. No rigidity.  GENITOURINARY: Deferred.  MUSCULOSKELETAL: Gait not tested. No tenderness or effusion. Range of motion adequate in all areas all joints. Strength and tone equal bilaterally.  SKIN: Inspection within normal limits.  LYMPHATIC: No cervical lymphadenopathy.  VASCULAR: Good dorsalis pedis and posterior tibial pulses.  NEUROLOGICAL: Alert, awake, following some commands, not oriented. Moving all 4 limbs. Strength of 4/4 in both upper and lower extremities. DTRs 2+ in both upper and lower extremities.   PSYCHIATRIC: Advanced dementia. Following some commands. Memory is impaired.    LABORATORY DATA: Serum glucose 157, BUN 22, creatinine 1.36, sodium 142, potassium 2.8, chloride 107, bicarbonate 24, estimated GFR 53. Troponin less than 0.02. WBC 10.5, hemoglobin 14.8, hematocrit 46.0, platelet count 225,000. Urinalysis unremarkable.   IMAGING STUDIES:  Chest x-ray: Mild cardiomegaly, right lung base atelectasis, otherwise negative for any focal consolidation or effusion.   CT of the head, noncontrast study:  1.  No acute intracranial pathology.  2.  Moderate cortical volume loss and scattered small vessel ischemic microangiopathy.  EKG: Paced rhythm, left axis deviation, right bundle branch block.    ASSESSMENT AND PLAN: An 81 year old Caucasian male with a past medical history significant for Alzheimer dementia, history of myocardial infarction status post coronary artery bypass graft, hypertension, history of gastrointestinal bleed x 2, status post permanent pacemaker, benign prostatic hypertrophy, on do not resuscitate status was brought by EMS to the Emergency Room with the complaint of sudden onset  of acute encephalopathy associated with generalized weakness concerning for transient ischemic attack versus cerebrovascular accident.   1.  Acute encephalopathy with generalized weakness concerning for transient ischemic attack versus cerebrovascular accident: The patient had decreased responsiveness which resolved and as time went by in the Emergency Room. The patient is alert, awake, and following commands at this time. Neurologic examination is normal, and no focal neurological deficits found at this time.  PLAN: Admit to telemetry. Neurologic checks q.4 hours. Order echocardiogram, carotid Dopplers. We will request for neurology consultation. We will defer MRI because of history of pacemaker placement.  2.  Hypokalemia: Etiology not known.  PLAN: Replace potassium. Follow BMP.  3.  History of Alzheimer dementia, on total care dependent by his wife at home: The patient is stable at present. We will continue home medications.  4.  History of hypertension: The patient on home medications. Continue home medications. Monitor blood pressure closely.  5.  History of coronary artery disease status post coronary artery bypass graft and status post permanent pacemaker: The patient is stable. Continue home medications.  6.  Renal insufficiency, mild, acute versus chronic: Encourage p.o. intake. Monitor BMP. Avoid nephrotoxic agents.  7.  Deep vein thrombosis prophylaxis: Subcutaneous Lovenox.  8.  Gastrointestinal prophylaxis: Protonix.   CODE STATUS: The patient is DNR.   TIME SPENT: 55 minutes.   ____________________________ Crissie Figures, MD enr:bm D: 10/02/2014 01:00:12 ET T: 10/02/2014 01:34:51 ET JOB#: [id number]  cc: Crissie Figures, MD, <Dictator> Stann Mainland. Sampson Goon, MD Crissie Figures MD ELECTRONICALLY SIGNED 10/02/2014 20:12

## 2015-01-31 NOTE — Consult Note (Signed)
PATIENT NAME:  Gregory Oconnor, Gregory Oconnor MR#:  [id number] DATE OF BIRTH:  11/15/1923  DATE OF CONSULTATION:  10/02/2014  REFERRING PHYSICIAN:   CONSULTING PHYSICIAN:  Pauletta BrownsYuriy Laniesha Das, MD  REASON FOR CONSULTATION:  Altered mental status.    HISTORY OF PRESENT ILLNESS:  This is a pleasant 81 year old male with history of Alzheimer disease on Aricept at baseline at home, history of hypertension, myocardial infarction, status post permanent pacemaker placement, presents with progressive confusion and altered mental status, upon admission found to have elevated BUN of 22 and creatinine 1.36. Started on hydration, was agitated throughout the night, started on Risperdal. As per wife who is at bedside with the patient, the patient's mental status improved and he is close to baseline but not there yet.   PAST MEDICAL HISTORY:  Alzheimer's, hypertension, history of myocardial infarct status post CABG, status post a pacemaker, history of gastrointestinal bleeding, history of benign prostatic hypertrophy, history of skin cancer.   PAST SURGICAL HISTORY: CABG, hernia repair.   HOME MEDICATIONS: Reviewed.   IMAGING: Reviewed. CAT scan of the head, no acute intracranial abnormality at this point.   REVIEW OF SYSTEMS:  Generalized weakness. No fever. No chills. No chest pain. No abdominal pain. No fecal incontinence. No diarrhea.  No heat or cold intolerance. No anxiety or depression. Positive history of dementia.   PHYSICAL EXAMINATION:  VITAL SIGNS: Include a temperature of 98, pulse 79, respirations 20, blood pressure 146/82. NEUROLOGIC: The patient tells me his name, could not tell me that this is the hospital, could not tell me the name of the hospital, did not know the month or the date. Speech appears to be fluent. Facial sensation intact. Facial motor is intact. Tongue is midline. Uvula elevates symmetrically. Shoulder shrug intact. Motor 4 + out of 5 bilateral upper and lower extremities. Reflexes  diminished. Gait could not be examined.   IMPRESSION: An 81 year old gentleman with history of Alzheimer's, hypertension, history of myocardial infarction status post permanent pacemaker placement, admitted for altered mental status, that has significantly improved.   PLAN: I think this is a combination of metabolic encephalopathy as well as dehydration. Continue the hydration. Agree with the Risperdal p.r.n. for agitation. As per family members at bedside mental status significantly improved and close to baseline. At this point I do not think the patient needs any further neurological intervention, do not think he needs any imaging.   Please call with any questions.  It is a pleasure seeing this patient. Thank you for this consultation.      ____________________________ Pauletta BrownsYuriy Guillaume Weninger, MD yz:bu D: 10/02/2014 13:41:28 ET T: 10/02/2014 14:27:39 ET JOB#: [id number]  cc: Pauletta BrownsYuriy Lindy Pennisi, MD, <Dictator> Pauletta BrownsYURIY Stephana Morell MD ELECTRONICALLY SIGNED 10/08/2014 13:50

## 2015-02-01 NOTE — Consult Note (Signed)
PATIENT NAME:  Gregory Oconnor, Gregory Oconnor MR#:  [id number] DATE OF BIRTH:  [birth year]  DATE OF CONSULTATION:  09/30/2011  REFERRING PHYSICIAN:  Hilda Lias, MD  CONSULTING PHYSICIAN:  Lutricia Feil, MD/Dawn Mort Sawyers, NP  REASON FOR CONSULTATION: Lower GI bleed.   HISTORY OF PRESENT ILLNESS: Mr. Ketelsen is an 81 year old Caucasian gentleman who was brought to Dr. Sharrell Ku office today for the concern of rectal bleeding and thus was directly admitted to [facility]. His wife is the one from whom much of the history is obtained other than Dr. Sharrell Ku note as the patient does have a history of dementia. Yesterday he was noted to have two episodes of blood being found in his underwear, bright red blood according to wife. Denies any clots. Due to his mental status, she is unable to elaborate what his normal bowel pattern habit is. Does state that feces was noted with each episode of rectal bleeding yesterday. He had one episode as well this morning at 8:30 and no evidence of continuation of bleeding since that time, just a small amount of feces noted this morning. He has not complained of abdominal pain. No nausea. No vomiting. No history of rectal bleeding in the past. He does have a known history of diverticulosis. He is two weeks status post umbilical hernia repair by Dr. Lemar Livings. According to Dr. Sharrell Ku history and physical, hemoglobin was 14.7 on 10/26 and on 11/28 was 15.2. Hemoglobin has not been rechecked today. Anal scope was performed during his office visit with Dr. Leavy Cella today in which there was no evidence of lacerations or bleeding hemorrhoids. There was evidence of blood in the canal, occult positive. Bloody internal hemorrhoids were noted, purplish in color but no signs of recent bleeding.   HOME MEDICATIONS:  1. Aspirin 81 mg a day. 2. Imdur 120 mg 1 daily. 3. Avodart 0.5 mg one Monday, Wednesday, Friday. 4. Cyanocobalamin 1000 mg chewable tablet once a day. 5. Aricept 10 mg 1  daily. 6. Norvasc 5 mg daily. 7. Nitroglycerin 0.4 mg tablet sublingual one every five minutes x3 as needed.  8. Norco 5/325 mg 1 to 2 tablets every six hours as needed.  9. Potassium chloride CR 10 mEq capsule 1 daily as needed.   ALLERGIES: Keflex, clarithromycin, erythromycin, penicillin.   PAST MEDICAL HISTORY:  1. Allergic rhinitis. 2. Coronary artery disease. 3. Dementia. 4. Diverticulosis. 5. Hypertension. 6. Myocardial infarction status post CABG 07/28/1994 with a catheterization in 2001, minor obstruction; cath 2006 distal blockages, medical management and stress test in 04/2007 which was unremarkable.  7. Renal insufficiency.  PAST SURGICAL HISTORY:  1. Colonoscopy May 2004 by Dr. Viviann Spare Bindrim revealing evidence of diverticulosis. 2. Incisional ventral hernia 2012.   FAMILY HISTORY: Mother and father history of colon cancer. Mother was diagnosed in her 27's and dad diagnosed in his 47's. History of coronary artery disease.   SOCIAL HISTORY: Retired Airline pilot. Resides with his wife. Former tobacco use. No alcohol.   REVIEW OF SYSTEMS: Unable to be obtained from patient due to patient's mental status, history of dementia. Again, history noted per wife as well as history and physical.   PHYSICAL EXAMINATION:   VITAL SIGNS: Vital temperature 98.5, pulse 67, respirations 18, blood pressure 157/95, pulse oximetry 95%.   GENERAL: Well developed, well nourished 81 year old Caucasian gentleman, no acute distress noted. Resting comfortably in bed.   HEENT: Normocephalic, atraumatic. Pupils equal, reactive to light. Conjunctivae clear. Sclerae anicteric.   NECK: Supple. Trachea midline. No lymphadenopathy or thyromegaly.  PULMONARY: Symmetric rise and fall of chest. Clear to auscultation throughout.   CARDIOVASCULAR: Irregular rate, S1, S2.   ABDOMEN: Soft, nondistended. Bowel sounds in four quadrants. No bruits, masses, guarding. Evidence of healing to umbilical area.  Steri-Strips in place.   RECTAL: Deferred as noted the findings from anal rectal examination proctoscopic exam by Dr. Leavy CellaBlocker.   MUSCULOSKELETAL: Moving all four extremities. No contractures. No clubbing.   EXTREMITIES: +1 edema bilaterally.   NEUROLOGIC: Alert and oriented to name. No gross deficits noted other than evidence of memory recall.   SKIN: Warm, dry. No lesions. No rashes.   LABORATORY, DIAGNOSTIC, AND RADIOLOGICAL DATA: Pending, following laboratory studies. CBC ordered stat as well as basic metabolic panel.   PLAN:  1. The patient's presentation was discussed with Dr. Lutricia FeilPaul Oh. At this time will await laboratory evaluation. Do recommend serial monitoring of hemoglobin and hematocrit. Given significant family history of colon cancer involving two first-degree relatives, mother as well as father, may need to consider proceeding forward with endoscopic evaluation. Again, at this point will await laboratory evaluation and determine if endoscopic evaluation is warranted as inpatient versus an outpatient.  2. Transfuse as necessary.  3. Continue IV hydration with close monitoring of hemodynamic status.   These services provided by Rodman Keyawn S. Harrison, NP under collaborative agreement with Lutricia FeilPaul Oh, MD. Thank you for allowing us to participate in the care of Carleene CooperEugene Mcphie.  ____________________________ Rodman Keyawn S. Harrison, NP dsh:drc D: 09/30/2011 15:10:15 ET T: 09/30/2011 16:46:59 ET JOB#: [id number]  cc: Rodman Keyawn S. Harrison, NP, <Dictator> Rodman KeyAWN S HARRISON MD ELECTRONICALLY SIGNED 10/12/2011 7:42

## 2015-02-01 NOTE — Discharge Summary (Signed)
PATIENT NAME:  Gregory Oconnor, Gregory Oconnor MR#:  [id number] DATE OF BIRTH:  06/24/1924  DATE OF ADMISSION:  09/30/2011 DATE OF DISCHARGE:  10/05/2011  PRIMARY CARE PHYSICIAN: Dr. Leavy Cella   FINAL DIAGNOSES:  1. Gastrointestinal bleed, diverticular.  2. Acute anemia requiring transfusion.  3. Hypokalemia and hypomagnesemia.  4. Chest pain.  5. Hypertension.  6. Dementia.  7. Benign prostatic hypertrophy.  8. Acute renal failure, improved.   MEDICATIONS ON DISCHARGE:  1. Avodart 0.5 mg Monday, Wednesday, Friday. 2. Imdur 120 mg daily.  3. Aricept 10 mg daily.  4. Amlodipine 5 mg daily.  5. Vitamin B12 1000 mcg daily.  6. Nitroglycerin 0.4 mg sublingually as needed. 7. Ferrous sulfate 325 mg daily.  8. Gabapentin 300 mg p.o. nightly.  9. Magnesium sulfate 400 mg p.o. daily for one week. 10. Pot 20 mEq p.o. daily for one week.  11. Do not take aspirin.   DIET: Low sodium.   ACTIVITY: As tolerated.   FOLLOW UP: Follow up in 1 to 2 weeks with Dr. Leavy Cella.   REASON FOR ADMISSION: Patient was admitted with bright red blood per rectum.   HISTORY OF PRESENT ILLNESS: 81 year old male came to his primary care physician's office with bright red blood per rectum. Primary care physician referred him into the hospital for direct admission. He was admitted with a lower gastrointestinal bleed. Serial hemoglobins ordered. Dr. Bluford Kaufmann, gastroenterology saw him as a consultation. Dr. Bluford Kaufmann did a colonoscopy on the 23rd. Multiple small and large mouth diverticula found in the entire colon. Significant amount of tics. Old blood throughout the colon.   LABORATORY, DIAGNOSTIC, AND RADIOLOGICAL DATA: Chemistry shows glucose 88, BUN 26, creatinine 1.27, sodium 144, potassium 3.7, chloride 108, CO2 28, white blood cell count 8.0, hemoglobin and hematocrit 11.9 and 35.3, platelet count 170, creatinine went up to 1.43 on the 22nd, potassium down to 3.2, hemoglobin drifted down 9.8 on the 22nd, down to 8.6 on the 23rd.  Magnesium on the 23rd 1.5, potassium 2.8. Patient was given a unit of blood hemoglobin of 8.6 with the active bleed. Rib x-ray on the 25th no fracture of the ribs on the left side. CT of the thoracic spine showed no evidence of acute abnormality of the thoracic spine. Serum protein electrophoresis normal fixation pattern, low total protein. Hemoglobin upon discharge 9.6. Magnesium 1.9, potassium 3.3.   HOSPITAL COURSE PER PROBLEM LIST:  1. For the patient's gastrointestinal bleed, it was believed to be diverticular. No active bleeding seen on colonoscopy. Bleeding just subsided. Aspirin will be held upon discharge.  2. For the patient's acute anemia, his hemoglobin drifted down throughout the hospitalization. He was transfused 1 unit of blood during the hospitalization on a hemoglobin of 8.6 since he was bleeding. His hemoglobin upon discharge is 9.6. He was started on ferrous sulfate upon discharge.  3. For his hypokalemia and hypomagnesemia, since he was on liquid diet this may have been contributing. He was given potassium and magnesium supplementation IV and orally during the hospitalization and upon discharge orally for one more week.  4. On the day of possible discharge on the 25th he developed chest pain, which is acute on the left side. It did have a reproducible component. His cardiac enzymes were negative. X-ray of the ribs was negative. SPEP negative. Thoracic spine CT scan was negative. This could have been shingles without the rash. Gabapentin was started at night. Patient was not having chest pain on the next day, day of discharge, so I am  not sure if the gabapentin helped or whether this was perceived pain with history of dementia. In speaking with family will continue the gabapentin.  5. Hypertension. Blood pressure currently stable on the Norvasc. Blood pressure upon discharge was 142/77.  6. For his dementia, he is on Aricept.  7. For his benign prostatic hypertrophy, he is on Avodart.   8. For his acute renal failure, I think the creatinine was up with the GI bleed and this had improved a little bit in the hospital.   TIME SPENT ON DISCHARGE: 35 minutes.   ____________________________ Herschell Dimesichard J. Renae GlossWieting, MD rjw:cms D: 10/06/2011 17:15:37 ET T: 10/07/2011 13:38:34 ET JOB#: [id number]  cc: Herschell Dimesichard J. Renae GlossWieting, MD, <Dictator> Rosalyn GessMichael E. Blocker, MD Salley ScarletICHARD J Weslee Prestage MD ELECTRONICALLY SIGNED 10/26/2011 16:01

## 2015-02-04 NOTE — Discharge Summary (Signed)
PATIENT NAME:  Gregory Oconnor, WANDEL MR#:  [id number] DATE OF BIRTH:  1924/02/02  DATE OF ADMISSION:  10/02/2014 DATE OF DISCHARGE:  10/03/2014  ADMITTING DIAGNOSIS: Altered mental status.   DISCHARGE DIAGNOSES:  1.  Altered mental status of unknown etiology.  2.  Dementia with behavioral disturbance.  3.  Renal insufficiency, chronic.  4.  Hyperglycemia; no diabetes mellitus. 5.  History of Alzheimer dementia.  6.  Hypertension.  7.  Coronary artery disease, status post coronary artery bypass grafting.  8.  Permanent pacemaker placement.  9.  Gastrointestinal bleed in the past.  10.  Benign prostatic hypertrophy.  11.  Skin cancer.  12.  Abnormal electrocardiogram.  DISCHARGE CONDITION: Stable.   DISCHARGE MEDICATIONS: The patient is to continue isosorbide mononitrate 120 mg p.o. daily, Aricept 10 mg p.o. at bedtime, Claritin 10 mg p.o. daily, Amlodipine 10 mg p.o. daily, gabapentin 100 mg p.o. daily as needed, vitamin B12 500 mcg p.o. twice daily, aspirin 81 mg p.o. daily, risperidone 0.5 mg twice daily and every 6 hours as needed, pantoprazole 40 mg p.o. daily, memantine which is Namenda 5 mg p.o. twice daily.   HOME OXYGEN: None.   DIET: Regular, regular consistency, mechanical soft.   ACTIVITY LIMITATIONS: As tolerated.   REFERRAL: To home health physical therapy as well as R.N.   FOLLOWUP APPOINTMENT: With Dr. Anne Hahn in 3 days after discharge.   CONSULTANTS: Care management, social work, neurologist, Dr. Pauletta Browns.   RADIOLOGIC STUDIES: Chest x-ray portable single view, 10/03/2014, showed mild cardiomegaly, right lung base atelectasis. CT scan of head without contrast 10/01/2014 revealed no acute intracranial pathology, moderate cortical volume loss and scattered small vessel ischemic micro angiopathy. Ultrasound of the carotids on 10/02/2014, showed mineral bilateral carotid atherosclerotic vascular disease. No hemodynamically significant stenosis. Ultrasound of kidneys  10/03/2014, showed no hydronephrosis, no acute abnormality, bilateral renal cysts which are unchanged. Echocardiogram 10/02/2014 revealed left ventricular ejection fraction by visual estimation of 50-55%, normal global left ventricular systolic function, decreased left ventricular internal cavity size, mild mitral valve regurgitation, mildly increased left ventricular posterior wall thickness, mild tricuspid regurgitation.   HOSPITAL COURSE: The patient is an 81 year old Caucasian male with history of Alzheimer dementia, who presents to the hospital after what looked like a syncopal episode with prolonged post syncopal episode. k refer to Dr. Jae Dire admission note on 10/02/2014. On arrival to the hospital, the patient's vital signs were temperature 97.5, pulse 75, respiration rate 11, blood pressure 209/95, and saturation 97% on room air. Physical examination was unremarkable.  The patient was able to follow some commands, although was not oriented. He was able to move all 4 limbs. Memory was impaired.   The patient's lab data done on arrival to the hospital showed elevated BUN and creatinine to 22 and 1.36, potassium level of 2.9, glucose 157; otherwise, BMP was unremarkable. The patient's liver enzymes were unremarkable. Cardiac enzymes were normal. CBC was within normal limits. Urinalysis was unremarkable. EKG showed presumed undetermined rhythm with premature atrial complexes, left axis deviation, right bundle branch block, inferior infarct age undetermined.   The patient was admitted to the hospital for further evaluation. Neurology consultation was obtained. Neurologist saw the patient in consultation the same day, 10/02/2014 and felt that the patient had a combination of metabolic encephalopathy due to dehydration, and recommended to continue hydration. Initially, the patient was on Risperdal as needed for agitation. He did not think that there was any need for further neurologic intervention or other  imaging.  The patient was initiated on Risperdal, with which he did relatively well, controlling his behavior. He had no more episodes of syncopal, and syncope workup was done, which was also unremarkable. The patient's EKG was unfortunately, the only thing was somewhat abnormal, with undetermined rhythm, and repeat EKG would be recommended for him as an outpatient. Unfortunately, the patient was not placed on telemetry, and no other abnormalities were reported. The patient improved as time progressed. His vitals remained stable. On the day of discharge, temperature was 97.5, pulse was ranging from 75 to 102, respiration rate was 34 to 45, and blood pressure was 158/85, saturation was 95% on room air at rest.   It was felt that the patient should to go home with current medications and follow up with neurologist as an outpatient in regard to management of his episodic altered mental status.   In regard to dementia with behavioral disturbance, the patient was initiated on Risperdal with  improvement in his condition. It is recommended to advance this medication or add any other medications if needed.   The patient also should have EKG repeated to make sure that his QTc interval is not too high, since in the hospital, QTc was reported around 500.   In regard to renal insufficiency, the patient's renal insufficiency remained stable, despite dehydration. No infection signs were noted. The patient was noted to be hypokalemic with potassium level of 2.9 on the day of admission, 10/01/2014. The patient's level was supplemented. It was recommended to follow the patient's potassium level as an outpatient.   In regard to his chronic medical problems, such as hypertension, coronary artery disease and BPH, the patient is to continue his outpatient management. Since the patient's altered mental status remained unclear, we initiated the patient on aspirin therapy with protective  medications: Protonix. We also  initiated him on memantine for his advanced Alzheimer dementia. He is being discharged in relatively stable condition with the above-mentioned medications and followup. On the day of discharge, his vitals are stable.   TIME SPENT: 40 minutes .   ____________________________ Katharina Caperima Sharlotte Baka, MD rv:MT D: 10/03/2014 15:08:58 ET T: 10/03/2014 15:31:51 ET JOB#: [id number]  cc: Katharina Caperima Claudean Leavelle, MD, <Dictator> Dr. Acquanetta SitWillis Burech Mcfarland MD ELECTRONICALLY SIGNED 10/21/2014 18:47

## 2015-10-23 ENCOUNTER — Encounter: Payer: Self-pay | Admitting: Emergency Medicine

## 2015-10-23 ENCOUNTER — Observation Stay
Admission: EM | Admit: 2015-10-23 | Discharge: 2015-10-26 | Disposition: A | Discharge status: Home / Self Care | Payer: Medicare Other | Attending: Internal Medicine | Admitting: Internal Medicine

## 2015-10-23 DIAGNOSIS — F0281 Dementia in other diseases classified elsewhere with behavioral disturbance: Secondary | ICD-10-CM | POA: Diagnosis not present

## 2015-10-23 DIAGNOSIS — I4581 Long QT syndrome: Secondary | ICD-10-CM | POA: Insufficient documentation

## 2015-10-23 DIAGNOSIS — D62 Acute posthemorrhagic anemia: Secondary | ICD-10-CM | POA: Diagnosis not present

## 2015-10-23 DIAGNOSIS — K922 Gastrointestinal hemorrhage, unspecified: Secondary | ICD-10-CM | POA: Diagnosis not present

## 2015-10-23 DIAGNOSIS — Z66 Do not resuscitate: Secondary | ICD-10-CM | POA: Insufficient documentation

## 2015-10-23 DIAGNOSIS — R451 Restlessness and agitation: Secondary | ICD-10-CM | POA: Insufficient documentation

## 2015-10-23 DIAGNOSIS — R109 Unspecified abdominal pain: Secondary | ICD-10-CM | POA: Insufficient documentation

## 2015-10-23 DIAGNOSIS — G309 Alzheimer's disease, unspecified: Secondary | ICD-10-CM | POA: Insufficient documentation

## 2015-10-23 DIAGNOSIS — E876 Hypokalemia: Secondary | ICD-10-CM | POA: Insufficient documentation

## 2015-10-23 DIAGNOSIS — I10 Essential (primary) hypertension: Secondary | ICD-10-CM

## 2015-10-23 DIAGNOSIS — N183 Chronic kidney disease, stage 3 unspecified: Secondary | ICD-10-CM

## 2015-10-23 DIAGNOSIS — K625 Hemorrhage of anus and rectum: Principal | ICD-10-CM

## 2015-10-23 DIAGNOSIS — K5790 Diverticulosis of intestine, part unspecified, without perforation or abscess without bleeding: Secondary | ICD-10-CM | POA: Insufficient documentation

## 2015-10-23 DIAGNOSIS — K921 Melena: Secondary | ICD-10-CM | POA: Insufficient documentation

## 2015-10-23 DIAGNOSIS — E860 Dehydration: Secondary | ICD-10-CM | POA: Diagnosis not present

## 2015-10-23 DIAGNOSIS — L8990 Pressure ulcer of unspecified site, unspecified stage: Secondary | ICD-10-CM | POA: Insufficient documentation

## 2015-10-23 DIAGNOSIS — K5900 Constipation, unspecified: Secondary | ICD-10-CM | POA: Insufficient documentation

## 2015-10-23 DIAGNOSIS — R748 Abnormal levels of other serum enzymes: Secondary | ICD-10-CM | POA: Insufficient documentation

## 2015-10-23 DIAGNOSIS — I129 Hypertensive chronic kidney disease with stage 1 through stage 4 chronic kidney disease, or unspecified chronic kidney disease: Secondary | ICD-10-CM | POA: Insufficient documentation

## 2015-10-23 DIAGNOSIS — G300 Alzheimer's disease with early onset: Secondary | ICD-10-CM

## 2015-10-23 DIAGNOSIS — K5791 Diverticulosis of intestine, part unspecified, without perforation or abscess with bleeding: Secondary | ICD-10-CM

## 2015-10-23 HISTORY — DX: Unspecified dementia, unspecified severity, without behavioral disturbance, psychotic disturbance, mood disturbance, and anxiety: F03.90

## 2015-10-23 HISTORY — DX: Gastrointestinal hemorrhage, unspecified: K92.2

## 2015-10-23 HISTORY — DX: Essential (primary) hypertension: I10

## 2015-10-23 LAB — HEMOGLOBIN AND HEMATOCRIT, BLOOD
HEMATOCRIT: 41.4 % (ref 40.0–52.0)
HEMOGLOBIN: 13.7 g/dL (ref 13.0–18.0)

## 2015-10-23 LAB — TYPE AND SCREEN
ABO/RH(D): A POS
ANTIBODY SCREEN: NEGATIVE

## 2015-10-23 LAB — CBC WITH DIFFERENTIAL/PLATELET
BASOS ABS: 0.1 10*3/uL (ref 0–0.1)
BASOS PCT: 1 %
Eosinophils Absolute: 0.1 10*3/uL (ref 0–0.7)
Eosinophils Relative: 2 %
HEMATOCRIT: 43.2 % (ref 40.0–52.0)
HEMOGLOBIN: 14.5 g/dL (ref 13.0–18.0)
LYMPHS PCT: 19 %
Lymphs Abs: 1.6 10*3/uL (ref 1.0–3.6)
MCH: 31.8 pg (ref 26.0–34.0)
MCHC: 33.5 g/dL (ref 32.0–36.0)
MCV: 95 fL (ref 80.0–100.0)
MONO ABS: 0.7 10*3/uL (ref 0.2–1.0)
Monocytes Relative: 9 %
NEUTROS ABS: 5.8 10*3/uL (ref 1.4–6.5)
NEUTROS PCT: 69 %
Platelets: 194 10*3/uL (ref 150–440)
RBC: 4.55 MIL/uL (ref 4.40–5.90)
RDW: 14.8 % — ABNORMAL HIGH (ref 11.5–14.5)
WBC: 8.3 10*3/uL (ref 3.8–10.6)

## 2015-10-23 LAB — COMPREHENSIVE METABOLIC PANEL
ALBUMIN: 4.2 g/dL (ref 3.5–5.0)
ALK PHOS: 60 U/L (ref 38–126)
ALT: 12 U/L — AB (ref 17–63)
AST: 21 U/L (ref 15–41)
Anion gap: 8 (ref 5–15)
BILIRUBIN TOTAL: 0.8 mg/dL (ref 0.3–1.2)
BUN: 33 mg/dL — AB (ref 6–20)
CO2: 27 mmol/L (ref 22–32)
CREATININE: 1.58 mg/dL — AB (ref 0.61–1.24)
Calcium: 9.2 mg/dL (ref 8.9–10.3)
Chloride: 107 mmol/L (ref 101–111)
GFR calc Af Amer: 43 mL/min — ABNORMAL LOW (ref >60)
GFR, EST NON AFRICAN AMERICAN: 37 mL/min — AB (ref >60)
GLUCOSE: 94 mg/dL (ref 65–99)
POTASSIUM: 3.3 mmol/L — AB (ref 3.5–5.1)
Sodium: 142 mmol/L (ref 135–145)
TOTAL PROTEIN: 7.5 g/dL (ref 6.5–8.1)

## 2015-10-23 LAB — LIPASE, BLOOD: LIPASE: 98 U/L — AB (ref 11–51)

## 2015-10-23 LAB — TROPONIN I: Troponin I: <0.03 ng/mL (ref <0.031)

## 2015-10-23 LAB — ABO/RH: ABO/RH(D): A POS

## 2015-10-23 LAB — CK: Total CK: 76 U/L (ref 49–397)

## 2015-10-23 LAB — APTT: APTT: 34 s (ref 24–36)

## 2015-10-23 LAB — PROTIME-INR
INR: 1.04
PROTHROMBIN TIME: 13.8 s (ref 11.4–15.0)

## 2015-10-23 MED ORDER — HEPARIN SODIUM (PORCINE) 5000 UNIT/ML IJ SOLN: 5000.0000 [IU] | Freq: Three times a day (TID) | INTRAMUSCULAR | Status: DC

## 2015-10-23 MED ORDER — ZIPRASIDONE HCL 20 MG PO CAPS
20.0000 mg | ORAL_CAPSULE | Freq: Two times a day (BID) | ORAL | Status: DC
  Administered 2015-10-23 – 2015-10-25 (×4): 20 mg via ORAL
  Filled 2015-10-23 (×6): qty 1

## 2015-10-23 MED ORDER — SODIUM CHLORIDE 0.9 % IV BOLUS (SEPSIS)
500.0000 mL | Freq: Once | INTRAVENOUS | Status: AC
  Administered 2015-10-23: 500 mL via INTRAVENOUS

## 2015-10-23 MED ORDER — ACETAMINOPHEN 650 MG RE SUPP: 650.0000 mg | Freq: Four times a day (QID) | RECTAL | Status: DC | PRN

## 2015-10-23 MED ORDER — SODIUM CHLORIDE 0.9 % IV BOLUS (SEPSIS)
1000.0000 mL | Freq: Once | INTRAVENOUS | Status: AC
  Administered 2015-10-23: 1000 mL via INTRAVENOUS

## 2015-10-23 MED ORDER — QUETIAPINE FUMARATE 25 MG PO TABS
25.0000 mg | ORAL_TABLET | Freq: Every day | ORAL | Status: DC
  Administered 2015-10-23 – 2015-10-24 (×3): 25 mg via ORAL
  Filled 2015-10-23 (×3): qty 1

## 2015-10-23 MED ORDER — HYDRALAZINE HCL 20 MG/ML IJ SOLN
10.0000 mg | Freq: Four times a day (QID) | INTRAMUSCULAR | Status: DC | PRN
  Administered 2015-10-25: 10 mg via INTRAVENOUS
  Filled 2015-10-23: qty 1

## 2015-10-23 MED ORDER — NITROGLYCERIN 0.4 MG SL SUBL: 0.4000 mg | SUBLINGUAL_TABLET | SUBLINGUAL | Status: DC | PRN

## 2015-10-23 MED ORDER — ONDANSETRON HCL 4 MG/2ML IJ SOLN: 4.0000 mg | Freq: Four times a day (QID) | INTRAMUSCULAR | Status: DC | PRN

## 2015-10-23 MED ORDER — QUETIAPINE FUMARATE 25 MG PO TABS
25.0000 mg | ORAL_TABLET | ORAL | Status: AC
  Administered 2015-10-23: 25 mg via ORAL
  Filled 2015-10-23: qty 1

## 2015-10-23 MED ORDER — AMLODIPINE BESYLATE 5 MG PO TABS
5.0000 mg | ORAL_TABLET | Freq: Every day | ORAL | Status: DC
  Administered 2015-10-23 – 2015-10-26 (×4): 5 mg via ORAL
  Filled 2015-10-23 (×5): qty 1

## 2015-10-23 MED ORDER — SODIUM CHLORIDE 0.9 % IV SOLN
INTRAVENOUS | Status: DC
Start: 2015-10-23 — End: 2015-10-24
  Administered 2015-10-23: 16:00:00 via INTRAVENOUS

## 2015-10-23 MED ORDER — ONDANSETRON HCL 4 MG PO TABS: 4.0000 mg | ORAL_TABLET | Freq: Four times a day (QID) | ORAL | Status: DC | PRN

## 2015-10-23 MED ORDER — ACETAMINOPHEN 325 MG PO TABS: 650.0000 mg | ORAL_TABLET | Freq: Four times a day (QID) | ORAL | Status: DC | PRN

## 2015-10-23 MED ORDER — ALUM & MAG HYDROXIDE-SIMETH 200-200-20 MG/5ML PO SUSP: 30.0000 mL | Freq: Four times a day (QID) | ORAL | Status: DC | PRN

## 2015-10-23 MED ORDER — ISOSORBIDE MONONITRATE ER 60 MG PO TB24
120.0000 mg | ORAL_TABLET | Freq: Every day | ORAL | Status: DC
  Administered 2015-10-23 – 2015-10-26 (×4): 120 mg via ORAL
  Filled 2015-10-23 (×4): qty 2

## 2015-10-23 MED ORDER — DONEPEZIL HCL 5 MG PO TABS
10.0000 mg | ORAL_TABLET | Freq: Every day | ORAL | Status: DC
  Administered 2015-10-23 – 2015-10-25 (×3): 10 mg via ORAL
  Filled 2015-10-23: qty 1
  Filled 2015-10-23 (×3): qty 2

## 2015-10-23 MED ORDER — VITAMIN B-12 1000 MCG PO TABS
1000.0000 ug | ORAL_TABLET | Freq: Every day | ORAL | Status: DC
  Administered 2015-10-23 – 2015-10-26 (×4): 1000 ug via ORAL
  Filled 2015-10-23 (×5): qty 1

## 2015-10-23 MED ORDER — ENOXAPARIN SODIUM 40 MG/0.4ML ~~LOC~~ SOLN
40.0000 mg | SUBCUTANEOUS | Status: DC
  Administered 2015-10-23: 40 mg via SUBCUTANEOUS
  Filled 2015-10-23: qty 0.4

## 2015-10-23 NOTE — [facility] Nurse Program (Signed)
GI Inpatient Consult Note  Reason for Consult:Rectal bleeding   Attending Requesting Consult:Dr. Konider  History of Present Illness: Gregory Oconnor is a 81 y.o. male who has a hx of diverticulosis and previous self limited bleeding a few months ago.  He had onset of bleeding with dark/marroon blood in diaper and EMS was called and patient brought to ER and seen by ER doctor who contacted me and I recommended admission due to uncertainty of likely repeat bleeding.  He has severe dementia and is totally unable to cooperate or comprehend exam or verbal interaction.  Past Medical History:  Past Medical History  Diagnosis Date  . GI bleed   . Dementia   . Hypertension     Problem List: Patient Active Problem List   Diagnosis Date Noted  . Rectal bleed 10/23/2015    Past Surgical History: History reviewed. No pertinent past surgical history.  Allergies: Allergies  Allergen Reactions  . Macrolides And Ketolides Hives and Other (See Comments)    agitation    Home Medications: Prescriptions prior to admission  Medication Sig Dispense Refill Last Dose  . amLODipine (NORVASC) 5 MG tablet Take 5 mg by mouth daily.   10/22/2015 at am  . Cyanocobalamin (RA VITAMIN B-12 TR) 1000 MCG TBCR Take 1,000 mg by mouth daily.   10/22/2015 at am  . donepezil (ARICEPT) 10 MG tablet Take 10 mg by mouth at bedtime.   10/22/2015 at pm  . isosorbide mononitrate (IMDUR) 120 MG 24 hr tablet Take 120 mg by mouth daily.   10/22/2015 at 1030  . nitroGLYCERIN (NITROSTAT) 0.4 MG SL tablet Place 0.4 mg under the tongue every 5 (five) minutes as needed for chest pain.    PRN  . QUEtiapine (SEROQUEL) 25 MG tablet Take 25-50 mg by mouth at bedtime.   10/22/2015 at pm   Home medication reconciliation was completed with the patient.   Scheduled Inpatient Medications:   . amLODipine  5 mg Oral Daily  . donepezil  10 mg Oral QHS  . enoxaparin (LOVENOX) injection  40 mg Subcutaneous Q24H  . isosorbide mononitrate   120 mg Oral Daily  . QUEtiapine  25-50 mg Oral QHS  . vitamin B-12  1,000 mcg Oral Daily    Continuous Inpatient Infusions:   . sodium chloride 75 mL/hr at 10/23/15 1544    PRN Inpatient Medications:  acetaminophen **OR** acetaminophen, alum & mag hydroxide-simeth, hydrALAZINE, nitroGLYCERIN, ondansetron **OR** ondansetron (ZOFRAN) IV  Family History: family history is not on file.  The patient's family history is negative for inflammatory bowel disorders, GI malignancy, or solid organ transplantation.  Social History:   reports that he has never smoked. He does not have any smokeless tobacco history on file. He reports that he does not drink alcohol or use illicit drugs. The patient denies ETOH, tobacco, or drug use.   Review of Systems:Unable to do because of severe dementia. Physical Examination: BP 137/94 mmHg  Pulse 87  Temp(Src)   Resp 12  Wt 72.576 kg (160 lb)  SpO2 98% Gen: Elderly WM who jumps at any physical contact. HEENT: No jaundice. Neck: not able to examine Chest: air flow present, does not tolerate CV: RRR,  Abd: flat, non distended. Ext: no edema,  Skin: no rash or lesions noted   Data: Lab Results  Component Value Date   WBC 8.3 10/23/2015   HGB 13.7 10/23/2015   HCT 41.4 10/23/2015   MCV 95.0 10/23/2015   PLT 194 10/23/2015  Recent Labs Lab 10/23/15 1022 10/23/15 1208  HGB 14.5 13.7   Lab Results  Component Value Date   NA 142 10/23/2015   K 3.3* 10/23/2015   CL 107 10/23/2015   CO2 27 10/23/2015   BUN 33* 10/23/2015   CREATININE 1.58* 10/23/2015   Lab Results  Component Value Date   ALT 12* 10/23/2015   AST 21 10/23/2015   ALKPHOS 60 10/23/2015   BILITOT 0.8 10/23/2015    Recent Labs Lab 10/23/15 1022  APTT 34  INR 1.04   Assessment/Plan: Gregory Oconnor is a [age] y.o. male with hx of recurrent LGI bleed with likely source of diverticulosis.  I discussed plan with wife and will hold off on rectal since was done in ER as  much as patient would allow.  Will primarily go on hgb and rechecking diaper as needed.  Severe dementia makes exam and intervention extremely difficult.   Will follow with you.  Recommendations:  Thank you for the consult. Please call with questions or concerns.  Lynnae PrudeELLIOTT, [place], MD

## 2015-10-23 NOTE — Progress Notes (Signed)
Pt kicking and hitting staff during toileting , will not allow anyone to touch him supervisor notified sw cheryl Christell ConstantMoore , saftety sitter with pt

## 2015-10-23 NOTE — ED Notes (Signed)
Pt given water, tolerated well

## 2015-10-23 NOTE — Care Management Obs Status (Signed)
MEDICARE OBSERVATION STATUS NOTIFICATION   Patient Details  Name: Gregory Oconnor MRN: [id number] Date of Birth: 19-Sep-1924   Medicare Observation Status Notification Given:  Yes    Berna BueCheryl Correne Lalani, RN 10/23/2015, 2:19 PM

## 2015-10-23 NOTE — Progress Notes (Signed)
Pt contninues to be uncoperative and combative hitting at nurse pt takes meds and food from wife not from staff, pt still will not allow nurse to complete assessment kicks nurse in stomach . geodon po given in applesause

## 2015-10-23 NOTE — ED Notes (Signed)
Family at bedside, pt calm at this time, resting in bed in no acute distress

## 2015-10-23 NOTE — Progress Notes (Signed)
Pt arrived combative hiting at nursing staff, pt wife states haldol and ativan have opposite effect on pt pt not allowing assessment nor vital sign , called Dr Luberta MutterKonidena orders received

## 2015-10-23 NOTE — ED Notes (Signed)
Pt given food tray per MD, resting in bed, wife at bedside

## 2015-10-23 NOTE — Consult Note (Signed)
Consultation  Referring Provider: Dr. Luberta MutterKonidena Primary Care Physician: Dr. Clydie Braunavid [facility]  Gastroenterologist:      Dr. Lynnae Prudeobert Elliott   Reason for Consultation: Rectal bleeding            HPI:   Gregory Oconnor is a 82 y.o. male with a known history of HTN, severe dementia, diverticular rectal bleed 2012,  2014,  brought in by EMS today because his wife noticed rectal bleeding in his diaper once this morning. She describes dark red  blood which dripped down his legs. This was "not as much as in the past." This was the first episode since August. He had one episode of small bloody BM in ER. Hgb 15.9 one year ago to 14.8 to 13.7. BUN 27  to now  BUN 33.  He has baseline constipation and last BM was 2 days ago. No recent digital disimpactions or enemas. He was fine yesterday. No fever chills, or illness. No vomiting or signs of abdominal pain. His dementia is markedly worsening to the point that he is incontinent, plays in his food, refuses to eat, has lost weight from 170s to 150s and is combative at home. His wife has been trying to care for him, but it is becoming impossible. She would like to talk to Zerita BoersHeather McKay about whether it is time to place him in a dementia skilled nursing unit. She knows he will not be able to drink the bowel prep for another colonoscopy and states she would not put him through colon cancer treatment if it was found.   HISTORY OF Chief Complaint: [facility] for one episode of rectal bleeding. Probable Diverticular Bleed given history. He was advised a  clear liquid diet over the next two days and hopefully rectal bleeding will stop. If it doesn't, he will need to be evaluated in the ER.  According To his wife the bleeding stopped.  He has had a slight smear of blood intermittently in his bowel movement for the last year so.    He was most recently hospitalized for a LGI bleed 03/2013 which was suspected to be  recurrent diverticular bleed. The patient was started on IV fluids and initially kept n.p.o. His hemoglobin dropped 2 grams from his previous hemoglobin and remained at 9.7 prior to discharge. Clear liquid diet was started and advanced to a soft diet as tolerated. The patient was seen by Dr. Servando SnareWohl and no further recommendations were given at this time.   His most recent colonoscopy was performed 10/02/11  by Dr. Bluford Kaufmannh for evaluation of Hematochezia, FH of Colon Cancer in 1st degree relative. Personal history of colonic polyps. Impression: Preparation of the colon was fair.   Multiple small and large mouth diverticula found in the entire colon.  There were significant amount of takes in the sigmoid area.  There is old blood throughout the colon but most blood in the sigmoid area as well.   Past Medical History  Diagnosis Date  . GI bleed   . Dementia   . Hypertension     History reviewed. No pertinent past surgical history.  History reviewed. No pertinent family history.   Social History  Substance Use Topics  . Smoking status: Never Smoker   . Smokeless tobacco: None  . Alcohol Use: No    Prior to Admission medications   Medication Sig Start Date End Date Taking? Authorizing Provider  amLODipine (NORVASC) 5 MG tablet Take 5 mg  by mouth daily.   Yes Historical Provider, MD  Cyanocobalamin (RA VITAMIN B-12 TR) 1000 MCG TBCR Take 1,000 mg by mouth daily.   Yes Historical Provider, MD  donepezil (ARICEPT) 10 MG tablet Take 10 mg by mouth at bedtime.   Yes Historical Provider, MD  isosorbide mononitrate (IMDUR) 120 MG 24 hr tablet Take 120 mg by mouth daily.   Yes Historical Provider, MD  nitroGLYCERIN (NITROSTAT) 0.4 MG SL tablet Place 0.4 mg under the tongue every 5 (five) minutes as needed for chest pain.    Yes Historical Provider, MD  QUEtiapine (SEROQUEL) 25 MG tablet Take 25-50 mg by mouth at bedtime.   Yes Historical Provider, MD    Current Facility-Administered Medications   Medication Dose Route Frequency Provider Last Rate Last Dose  . 0.9 %  sodium chloride infusion   Intravenous Continuous Katha Hamming, MD 75 mL/hr at 10/23/15 1544    . acetaminophen (TYLENOL) tablet 650 mg  650 mg Oral Q6H PRN Katha Hamming, MD       Or  . acetaminophen (TYLENOL) suppository 650 mg  650 mg Rectal Q6H PRN Katha Hamming, MD      . alum & mag hydroxide-simeth (MAALOX/MYLANTA) 200-200-20 MG/5ML suspension 30 mL  30 mL Oral Q6H PRN Katha Hamming, MD      . amLODipine (NORVASC) tablet 5 mg  5 mg Oral Daily Katha Hamming, MD   5 mg at 10/23/15 1418  . donepezil (ARICEPT) tablet 10 mg  10 mg Oral QHS Katha Hamming, MD      . enoxaparin (LOVENOX) injection 40 mg  40 mg Subcutaneous Q24H Katha Hamming, MD      . hydrALAZINE (APRESOLINE) injection 10 mg  10 mg Intravenous Q6H PRN Katha Hamming, MD      . isosorbide mononitrate (IMDUR) 24 hr tablet 120 mg  120 mg Oral Daily Katha Hamming, MD   120 mg at 10/23/15 1548  . nitroGLYCERIN (NITROSTAT) SL tablet 0.4 mg  0.4 mg Sublingual Q5 min PRN Katha Hamming, MD      . ondansetron (ZOFRAN) tablet 4 mg  4 mg Oral Q6H PRN Katha Hamming, MD       Or  . ondansetron (ZOFRAN) injection 4 mg  4 mg Intravenous Q6H PRN Katha Hamming, MD      . QUEtiapine (SEROQUEL) tablet 25-50 mg  25-50 mg Oral QHS Katha Hamming, MD      . vitamin B-12 (CYANOCOBALAMIN) tablet 1,000 mcg  1,000 mcg Oral Daily Katha Hamming, MD        Allergies as of 10/23/2015 - Review Complete 10/23/2015  Allergen Reaction Noted  . Macrolides and ketolides Hives and Other (See Comments) 10/23/2015     Review of Systems:     Questions were answered by his wife as noted in HPI.  He has seen Dr. Malvin Johns in Neurology and has been placed on Seroquel.  Patient says sometimes it helps, "but he is always like this in the hospital. "     Physical Exam:  Vital signs in last 24 hours: Pulse Rate:   [66-92] 92 (01/13 1600) Resp:  [12-20] 20 (01/13 1600) BP: (137-187)/(83-109) 166/83 mmHg (01/13 1600) SpO2:  [98 %-100 %] 98 % (01/13 1207) Weight:  [72.576 kg (160 lb)] 72.576 kg (160 lb) (01/13 1021)    General:  Thin, disheveled,  "Get away from me!" Trying to get out of bed. Wife at bedside. Head:  Head without obvious abnormality, atraumatic  Neck:   Trachea midline Lungs:  Clear to auscultation bilaterally, respirations unlabored  Heart:     Normal S1S2 Abdomen: Soft, non-tender Rectal: Deferred as patient is very agitated  Lymph:  No cervical or supraclavicular adenopathy. Extremities:   No edema,  Skin  Skin color no jaundice Neuro:  Strong bilat and movement of extremities x 4.  Psych:  Agitated, confused  Data Reviewed:  LAB RESULTS:  Recent Labs  10/23/15 1022 10/23/15 1208  WBC 8.3  --   HGB 14.5 13.7  HCT 43.2 41.4  PLT 194  --    BMET  Recent Labs  10/23/15 1022  NA 142  K 3.3*  CL 107  CO2 27  GLUCOSE 94  BUN 33*  CREATININE 1.58*  CALCIUM 9.2   LFT  Recent Labs  10/23/15 1022  PROT 7.5  ALBUMIN 4.2  AST 21  ALT 12*  ALKPHOS 60  BILITOT 0.8   PT/INR  Recent Labs  10/23/15 1022  LABPROT 13.8  INR 1.04    STUDIES: No results found.   Assessment and Plan:  MARIS BENA is a 82 y.o. with acute episode of rectal bleeding likely secondary to diverticulosis with constipation. His most recent colonoscopy was in 2012. He is hemodynamically stable and plans are to monitor the Hgb and watch for further bleeding overnight.   He has severe dementia and is quite combative and unfortunately a DRE cannot be performed at this time.  Medications have been ordered. For chronic  constipation step up therapy with stool softeners.  He takes a baseline calcium channel blocker. No recommendation for luminal evaluation. Mild elevation in BUN is likely from dehydration and continue IV fluids. Recommend social services for help connecting his wife  with Zerita Boers and decision making on need for advanced dementia care placement.  Code;Status DO NOT RESUSCITATE, discussed with the wife.  This case was discussed with Dr. Scot Jun in collaboration of care. Thank you for the consultation.  These services provided by Amedeo Kinsman RN, MSN, ANP-BC under collaborative practice agreement with Scot Jun, MD.  10/23/2015, 5:20 PM

## 2015-10-23 NOTE — H&P (Signed)
[facility] - [place] at [facility]   PATIENT NAME: Gregory Oconnor    MR#:  [id number]  DATE OF BIRTH:  [birth year]  DATE OF ADMISSION:  10/23/2015  PRIMARY CARE PHYSICIAN: No primary care provider on file.   REQUESTING/REFERRING PHYSICIAN: Dr. Fanny BienQuale  CHIEF COMPLAINT: 82 year old male patient with severe dementia brought in by the wife secondary to rectal bleeding.    Chief Complaint  Patient presents with  . Abdominal Pain  . Rectal Bleeding    HISTORY OF PRESENT ILLNESS:  Gregory Oconnor  is a 82 y.o. male with a known history of htn,severe dementia, brought in by the wife because she noticed rectal bleeding and his diaper was full of blood. It happened today morning. He was fine yesterday. No fever. No nausea, no vomiting. Last BM was 2 days ago. Has episodes of constipation. Patient has severe dementia and unable to give me any history.,He had one  Episode of small bloody BM in ER>. Was very agitated earlier and received a Seroquel. And wife is at bedside. According to her has severe dementia and agitation spells and she has a home health nurse that helps her.  PAST MEDICAL HISTORY:   Past Medical History  Diagnosis Date  . GI bleed   . Dementia   . Hypertension     PAST SURGICAL HISTOIRY:  History reviewed. No pertinent past surgical history.  SOCIAL HISTORY:   Social History  Substance Use Topics  . Smoking status: Never Smoker   . Smokeless tobacco: Not on file  . Alcohol Use: No    FAMILY HISTORY:  History reviewed. No pertinent family history.  DRUG ALLERGIES:   Allergies  Allergen Reactions  . Macrolides And Ketolides Hives and Other (See Comments)    agitation    REVIEW OF SYSTEMS:unable to obtain review of systems secondary to dementia.     MEDICATIONS AT HOME:   Prior to Admission medications   Medication Sig Start Date End Date Taking? Authorizing Provider  amLODipine (NORVASC) 5 MG tablet Take 5 mg by mouth  daily.   Yes Historical Provider, MD  Cyanocobalamin (RA VITAMIN B-12 TR) 1000 MCG TBCR Take 1,000 mg by mouth daily.   Yes Historical Provider, MD  donepezil (ARICEPT) 10 MG tablet Take 10 mg by mouth at bedtime.   Yes Historical Provider, MD  isosorbide mononitrate (IMDUR) 120 MG 24 hr tablet Take 120 mg by mouth daily.   Yes Historical Provider, MD  nitroGLYCERIN (NITROSTAT) 0.4 MG SL tablet Place 0.4 mg under the tongue every 5 (five) minutes as needed for chest pain.    Yes Historical Provider, MD  QUEtiapine (SEROQUEL) 25 MG tablet Take 25-50 mg by mouth at bedtime.   Yes Historical Provider, MD      VITAL SIGNS:  Blood pressure 187/102, pulse 87, resp. rate 12, weight 72.576 kg (160 lb), SpO2 98 %.  PHYSICAL EXAMINATION:  GENERAL:  82 y.o.-year-old patient lying in the bed with no acute distress.  EYES: Pupils equal, round, reactive to light and accommodation. No scleral icterus. Extraocular muscles intact.  HEENT: Head atraumatic, normocephalic. Oropharynx and nasopharynx clear.  NECK:  Supple, no jugular venous distention. No thyroid enlargement, no tenderness.  LUNGS: Normal breath sounds bilaterally, no wheezing, rales,rhonchi or crepitation. No use of accessory muscles of respiration.  CARDIOVASCULAR: S1, S2 normal. No murmurs, rubs, or gallops.  ABDOMEN:;facial grimace  to palpation of left lower quadrant of abdomen. Soft, BS.,not distended.Marland Kitchen.  EXTREMITIES: No pedal edema, cyanosis, or  clubbing.  NEUROLOGIC: Unable to do full neurological exam secondary to severe dementia and not able to follow commands.  PSYCHIATRIC: severely demented and not oriented to time, place, person., SKIN: No obvious rash, lesion, or ulcer.   LABORATORY PANEL:   CBC  Recent Labs Lab 10/23/15 1022 10/23/15 1208  WBC 8.3  --   HGB 14.5 13.7  HCT 43.2 41.4  PLT 194  --     ------------------------------------------------------------------------------------------------------------------  Chemistries   Recent Labs Lab 10/23/15 1022  NA 142  K 3.3*  CL 107  CO2 27  GLUCOSE 94  BUN 33*  CREATININE 1.58*  CALCIUM 9.2  AST 21  ALT 12*  ALKPHOS 60  BILITOT 0.8   ------------------------------------------------------------------------------------------------------------------  Cardiac Enzymes  Recent Labs Lab 10/23/15 1022  TROPONINI <0.03   ------------------------------------------------------------------------------------------------------------------  RADIOLOGY:  No results found.  EKG:   Orders placed or performed in visit on 10/02/14  . EKG 12-Lead    IMPRESSION AND PLAN:  #1. Rectal bleeding likely secondary to severe constipation: Hemodynamically stable hemoglobin stable watch overnight  For further episodes of rectal bleed,cbc q 8hrs,GI consult. 2. Constipation: Use stool softeners and # 3 mild acute renal failure likely ATN from dehydration and continue IV fluids. #4 ,accelerated hypertension secondary to severe agitation; use  Seroquel,Aricept 5. Hypertension:, BP is high secondary to agitation: Use Norvasc, add when necessary  IVhydralazine. #6 hypokalemia replace the potassium.  code;Status DO NOT RESUSCITATE, discussed with the wife.  All the records are reviewed and case discussed with ED provider. Management plans discussed with the patient, family and they are in agreement.  CODE STATUS: DNR  TOTAL TIME TAKING CARE OF THIS PATIENT: .    Katha Hamming M.D on 10/23/2015 at 2:11 PM  Between 7am to 6pm - Pager - [phone]  After 6pm go to [email or web address] - password [email or web address] [facility]  [place] [facility]  Office  [phone]  CC: Primary care physician; No primary care provider on file.  Note: This dictation was prepared with Dragon dictation along with smaller phrase technology. Any  transcriptional errors that result from this process are unintentional.

## 2015-10-23 NOTE — ED Notes (Signed)
Posey activity apron given to pt, wife at bedside

## 2015-10-23 NOTE — ED Provider Notes (Signed)
[facility] Emergency Department Provider Note  ____________________________________________  Time seen: Approximately 10:26 AM  I have reviewed the triage vital signs and the nursing notes.   HISTORY  Chief Complaint Abdominal Pain and Rectal Bleeding  EM caveat: Patient's severe dementia and baseline confusion and unable to provide review of systems or participate in more than very simple commands  HPI Gregory Oconnor is a 82 y.o. male presents for evaluation of rectal bleeding.  No family available at bedside. EMS reports the patient was noted to have a large amount of blood in his diaper unchanged today. He does have a history of prior bleeding. He is agitated and occasionally combative.  Past Medical History  Diagnosis Date  . GI bleed   . Dementia   . Hypertension     There are no active problems to display for this patient.   History reviewed. No pertinent past surgical history.  Current Outpatient Rx  Name  Route  Sig  Dispense  Refill  . amLODipine (NORVASC) 5 MG tablet   Oral   Take 5 mg by mouth daily.         . Cyanocobalamin (RA VITAMIN B-12 TR) 1000 MCG TBCR   Oral   Take 1,000 mg by mouth daily.         Marland Kitchen donepezil (ARICEPT) 10 MG tablet   Oral   Take 10 mg by mouth at bedtime.         . isosorbide mononitrate (IMDUR) 120 MG 24 hr tablet   Oral   Take 120 mg by mouth daily.         . nitroGLYCERIN (NITROSTAT) 0.4 MG SL tablet   Sublingual   Place 0.4 mg under the tongue every 5 (five) minutes as needed for chest pain.          Marland Kitchen QUEtiapine (SEROQUEL) 25 MG tablet   Oral   Take 25-50 mg by mouth at bedtime.           Allergies Macrolides and ketolides  History reviewed. No pertinent family history.  Social History Social History  Substance Use Topics  . Smoking status: Never Smoker   . Smokeless tobacco: None  . Alcohol Use: No    Review of Systems EM  caveat    ____________________________________________   PHYSICAL EXAM:  VITAL SIGNS: ED Triage Vitals  Enc Vitals Group     BP 10/23/15 1021 178/107 mmHg     Pulse Rate 10/23/15 1021 67     Resp 10/23/15 1021 18     Temp --      Temp src --      SpO2 10/23/15 1021 100 %     Weight 10/23/15 1021 160 lb (72.576 kg)     Height --      Head Cir --      Peak Flow --      Pain Score --      Pain Loc --      Pain Edu? --      Excl. in GC? --    Constitutional: Alert and disoriented. He is calm except when examined in which case he somewhat with draws and resists exam. Eyes: Conjunctivae are normal. PERRL. EOMI. Head: Atraumatic. Nose: No congestion/rhinnorhea. Mouth/Throat: Mucous membranes are dry.  Oropharynx non-erythematous. Neck: No stridor.   Cardiovascular: Normal rate, regular rhythm. Grossly normal heart sounds.  Good peripheral circulation. Respiratory: Normal respiratory effort.  No retractions. Lungs CTAB. Gastrointestinal: Soft and nontender. No distention.  No abdominal bruits. No CVA tenderness. Old surgical scar is noted. No rebound or guarding. Rectal exam demonstrates no external hemorrhoid and moderate amount of active lower GI bleeding with dried blood notably around the diaper area and perineum. Musculoskeletal: No lower extremity tenderness nor edema.  Neurologic:  We'll follow very simple commands such as assisting staff to roll on his side. No gross focal neurologic deficits are appreciated though exam is very limited.  Skin:  Skin is warm, dry and intact. No rash noted. Psychiatric: Mood and affect are flat, occasionally agitated when examined.  ____________________________________________   LABS (all labs ordered are listed, but only abnormal results are displayed)  Labs Reviewed  CBC WITH DIFFERENTIAL/PLATELET - Abnormal; Notable for the following:    RDW 14.8 (*)    All other components within normal limits  COMPREHENSIVE METABOLIC PANEL -  Abnormal; Notable for the following:    Potassium 3.3 (*)    BUN 33 (*)    Creatinine, Ser 1.58 (*)    ALT 12 (*)    GFR calc non Af Amer 37 (*)    GFR calc Af Amer 43 (*)    All other components within normal limits  LIPASE, BLOOD - Abnormal; Notable for the following:    Lipase 98 (*)    All other components within normal limits  CK  TROPONIN I  APTT  PROTIME-INR  HEMOGLOBIN AND HEMATOCRIT, BLOOD  ABO/RH  TYPE AND SCREEN   ____________________________________________  EKG  Reviewed and interpreted by me at 10:30 AM Heart rate 66 Significant baseline artifact is noted, the EKG does appear to have a regular rhythm QRS 160 QTC 540 Reviewed and interpreted as probable left ventricular hypertrophy, likely sinus rhythm with significant underlying artifact though cannot exclude atrial arrhythmia No evidence of acute ischemic abnormality ____________________________________________  RADIOLOGY   ____________________________________________   PROCEDURES  Procedure(s) performed: None  Critical Care performed: No  ____________________________________________   INITIAL IMPRESSION / ASSESSMENT AND PLAN / ED COURSE  Pertinent labs & imaging results that were available during my care of the patient were reviewed by me and considered in my medical decision making (see chart for details).  Patient presents for painless GI bleeding. His medications are reviewed and he does not appear to be on any anticoagulants or aspirin. Review of previous charts notes that he has a history of diverticular bleeding with very similar presentations. He has been admitted previously and last saw GI for similar symptoms in August. At this point suspect likely either same, and we will check blood count. As he demonstrates no abdominal tenderness I do not believe he benefit from CT scan in the emergency department, but rather close observation, review of laboratory analysis, and GI  consultation.  ----------------------------------------- 1:17 PM on 10/23/2015 -----------------------------------------  Patient had bowel movement with a slight amount of blood still in it. Reevaluation of the bowel and rectum demonstrates a small amount of dried blood, probable some slight but minimal ongoing bleeding also associated with a mild drop in his hemoglobin. Discussed case and care with Dr. Mechele Collin of gastroenterology who advises admission and observation for GI consultation. Discussed with the patient's wife who is agreeable. Patient does have severe dementia, occasional agitation. At this time he is frequently trying to get up from bed, requiring frequent re-assistance and redirection. Wife reports Seroquel has worked well, and we will give him his home dose of Seroquel at this time for agitation. Admit to the hospitalist service. ____________________________________________   FINAL CLINICAL IMPRESSION(S) /  ED DIAGNOSES  Final diagnoses:  Rectal bleed  Gastrointestinal hemorrhage associated with intestinal diverticulosis      Sharyn CreamerMark Clydene Burack, MD 10/23/15 1319

## 2015-10-23 NOTE — ED Notes (Signed)
Pt to ed via ems from home with c/o rectal bleeding.  Per family pt had large amount of blood noted in diaper.  Pt combative on arrival.

## 2015-10-23 NOTE — Care Management Obs Status (Signed)
MEDICARE OBSERVATION STATUS NOTIFICATION   Patient Details  Name: Gregory Oconnor MRN: [id number] Date of Birth: June 17, 1924   Medicare Observation Status Notification Given:  Yes Notice given to wife at bedside due to pt. Dementia.    Berna Bueheryl Shikha Bibb, RN 10/23/2015, 2:19 PM

## 2015-10-24 DIAGNOSIS — N183 Chronic kidney disease, stage 3 unspecified: Secondary | ICD-10-CM

## 2015-10-24 DIAGNOSIS — E876 Hypokalemia: Secondary | ICD-10-CM

## 2015-10-24 DIAGNOSIS — I10 Essential (primary) hypertension: Secondary | ICD-10-CM

## 2015-10-24 DIAGNOSIS — D62 Acute posthemorrhagic anemia: Secondary | ICD-10-CM

## 2015-10-24 LAB — BASIC METABOLIC PANEL
Anion gap: 10 (ref 5–15)
Anion gap: 6 (ref 5–15)
BUN: 31 mg/dL — AB (ref 6–20)
BUN: 28 mg/dL — ABNORMAL HIGH (ref 6–20)
CALCIUM: 8.7 mg/dL — AB (ref 8.9–10.3)
CALCIUM: 8.4 mg/dL — AB (ref 8.9–10.3)
CO2: 23 mmol/L (ref 22–32)
CO2: 25 mmol/L (ref 22–32)
CREATININE: 1.35 mg/dL — AB (ref 0.61–1.24)
CREATININE: 1.31 mg/dL — AB (ref 0.61–1.24)
Chloride: 112 mmol/L — ABNORMAL HIGH (ref 101–111)
Chloride: 112 mmol/L — ABNORMAL HIGH (ref 101–111)
GFR calc Af Amer: 52 mL/min — ABNORMAL LOW (ref >60)
GFR, EST AFRICAN AMERICAN: 54 mL/min — AB (ref >60)
GFR, EST NON AFRICAN AMERICAN: 45 mL/min — AB (ref >60)
GFR, EST NON AFRICAN AMERICAN: 47 mL/min — AB (ref >60)
GLUCOSE: 97 mg/dL (ref 65–99)
Glucose, Bld: 101 mg/dL — ABNORMAL HIGH (ref 65–99)
POTASSIUM: 3 mmol/L — AB (ref 3.5–5.1)
Potassium: 2.8 mmol/L — CL (ref 3.5–5.1)
SODIUM: 145 mmol/L (ref 135–145)
Sodium: 143 mmol/L (ref 135–145)

## 2015-10-24 LAB — CBC
HEMATOCRIT: 36.6 % — AB (ref 40.0–52.0)
Hemoglobin: 12.5 g/dL — ABNORMAL LOW (ref 13.0–18.0)
MCH: 32.1 pg (ref 26.0–34.0)
MCHC: 34.1 g/dL (ref 32.0–36.0)
MCV: 94.2 fL (ref 80.0–100.0)
PLATELETS: 168 10*3/uL (ref 150–440)
RBC: 3.88 MIL/uL — ABNORMAL LOW (ref 4.40–5.90)
RDW: 14.8 % — AB (ref 11.5–14.5)
WBC: 7.2 10*3/uL (ref 3.8–10.6)

## 2015-10-24 LAB — MAGNESIUM: Magnesium: 1.9 mg/dL (ref 1.7–2.4)

## 2015-10-24 LAB — POTASSIUM: Potassium: 3.3 mmol/L — ABNORMAL LOW (ref 3.5–5.1)

## 2015-10-24 LAB — HEMOGLOBIN: Hemoglobin: 12 g/dL — ABNORMAL LOW (ref 13.0–18.0)

## 2015-10-24 MED ORDER — DOCUSATE SODIUM 100 MG PO CAPS: 100.0000 mg | ORAL_CAPSULE | Freq: Two times a day (BID) | ORAL | Status: DC

## 2015-10-24 MED ORDER — POTASSIUM CHLORIDE 10 MEQ/100ML IV SOLN
10.0000 meq | INTRAVENOUS | Status: AC
  Administered 2015-10-24 (×4): 10 meq via INTRAVENOUS
  Filled 2015-10-24 (×4): qty 100

## 2015-10-24 MED ORDER — AMLODIPINE BESYLATE 10 MG PO TABS: 10.0000 mg | ORAL_TABLET | Freq: Every day | ORAL | Status: DC

## 2015-10-24 MED ORDER — QUETIAPINE FUMARATE 25 MG PO TABS: 25.0000 mg | ORAL_TABLET | Freq: Every day | ORAL | Status: AC

## 2015-10-24 MED ORDER — SENNA 8.6 MG PO TABS
1.0000 | ORAL_TABLET | Freq: Every day | ORAL | Status: DC | PRN
Start: 2015-10-24 — End: 2015-10-26

## 2015-10-24 MED ORDER — DOCUSATE SODIUM 50 MG/5ML PO LIQD
100.0000 mg | Freq: Two times a day (BID) | ORAL | Status: DC
  Administered 2015-10-24 – 2015-10-25 (×2): 100 mg via ORAL
  Filled 2015-10-24 (×5): qty 10

## 2015-10-24 MED ORDER — KCL IN DEXTROSE-NACL 20-5-0.45 MEQ/L-%-% IV SOLN
INTRAVENOUS | Status: DC
  Administered 2015-10-24 – 2015-10-26 (×3): via INTRAVENOUS
  Filled 2015-10-24 (×5): qty 1000

## 2015-10-24 MED ORDER — FERROUS SULFATE 220 (44 FE) MG/5ML PO ELIX
220.0000 mg | ORAL_SOLUTION | Freq: Every day | ORAL | Status: DC
  Administered 2015-10-25 – 2015-10-26 (×2): 220 mg via ORAL
  Filled 2015-10-24 (×3): qty 5

## 2015-10-24 MED ORDER — ZIPRASIDONE HCL 20 MG PO CAPS: 20.0000 mg | ORAL_CAPSULE | Freq: Two times a day (BID) | ORAL | Status: DC

## 2015-10-24 NOTE — Plan of Care (Signed)
Problem: Education: Goal: Knowledge of [facility] Education information/materials will improve Outcome: Not Progressing Dementia    

## 2015-10-24 NOTE — Consult Note (Signed)
GI Inpatient Follow-up Note  Patient Identification: Gregory Oconnor is a 82 y.o. male with severe dementia, history of diverticular bleeds, admitted for rectal bleeding.  Subjective: I spoke with the nurse who reported that he has not had any further bm's since our last note.  They have changed his diaper and have not noted any blood.  He is very combative and had just fallen asleep.   Scheduled Inpatient Medications:  . amLODipine  5 mg Oral Daily  . donepezil  10 mg Oral QHS  . enoxaparin (LOVENOX) injection  40 mg Subcutaneous Q24H  . isosorbide mononitrate  120 mg Oral Daily  . QUEtiapine  25-50 mg Oral QHS  . vitamin B-12  1,000 mcg Oral Daily  . ziprasidone  20 mg Oral BID WC    Continuous Inpatient Infusions:   . dextrose 5 % and 0.45 % NaCl with KCl 20 mEq/L 75 mL/hr at 10/24/15 0909    PRN Inpatient Medications:  acetaminophen **OR** acetaminophen, alum & mag hydroxide-simeth, hydrALAZINE, nitroGLYCERIN, ondansetron **OR** ondansetron (ZOFRAN) IV  Review of Systems: Did not assess due to condition  Physical Examination: BP 172/92 mmHg  Pulse 65  Temp(Src) 97.5 F (36.4 C) (Oral)  Resp 20  Ht 5\' 10"  (1.778 m)  Wt 64.592 kg (142 lb 6.4 oz)  BMI 20.43 kg/m2  SpO2 99% Gen:did not wake due to combativeness   Data: Lab Results  Component Value Date   WBC 7.2 10/24/2015   HGB 12.5* 10/24/2015   HCT 36.6* 10/24/2015   MCV 94.2 10/24/2015   PLT 168 10/24/2015    Recent Labs Lab 10/23/15 1022 10/23/15 1208 10/24/15 0618  HGB 14.5 13.7 12.5*   Lab Results  Component Value Date   NA 145 10/24/2015   K 3.0* 10/24/2015   CL 112* 10/24/2015   CO2 23 10/24/2015   BUN 31* 10/24/2015   CREATININE 1.35* 10/24/2015   Lab Results  Component Value Date   ALT 12* 10/23/2015   AST 21 10/23/2015   ALKPHOS 60 10/23/2015   BILITOT 0.8 10/23/2015    Recent Labs Lab 10/23/15 1022  APTT 34  INR 1.04   Assessment/Plan: Mr. Gregory Oconnor is a 82 y.o. male  with severe dementia with combativeness, history of diverticular bleeding.  No further bleeding reported.  Hgb down from 14.5 to 12.5.  Elevated lipase on admission of 98.    Recommendations: We recommend continuing to monitor stool/diaper for blood. Continue with current treatment plan.  We will continue to follow with you. Please call with questions or concerns.  Carney Harderari S Richards, PA-C  I personally performed these services.

## 2015-10-24 NOTE — Progress Notes (Signed)
[facility] - [place] at [facility]   PATIENT NAME: Gregory Oconnor    MR#:  [id number]  DATE OF BIRTH:  04/26/1924  SUBJECTIVE:  CHIEF COMPLAINT:   Chief Complaint  Patient presents with  . Abdominal Pain  . Rectal Bleeding    Review of Systems  Unable to perform ROS: dementia    VITAL SIGNS: Blood pressure 172/92, pulse 65, temperature 97.5 F (36.4 C), temperature source Oral, resp. rate 20, height 5\' 10"  (1.778 m), weight 64.592 kg (142 lb 6.4 oz), SpO2 99 %.  PHYSICAL EXAMINATION:   GENERAL:  82 y.o.-year-old patient lying in the bed with no acute distress. Comfortably sitting in bed.  EYES: Pupils equal, round, reactive to light and accommodation. No scleral icterus. Extraocular muscles intact.  HEENT: Head atraumatic, normocephalic. Oropharynx and nasopharynx clear.  NECK:  Supple, no jugular venous distention. No thyroid enlargement, no tenderness.  LUNGS: Normal breath sounds bilaterally, no wheezing, rales,rhonchi or crepitation. No use of accessory muscles of respiration.  CARDIOVASCULAR: S1, S2 normal. No murmurs, rubs, or gallops.  ABDOMEN: Soft, nontender, nondistended. Bowel sounds present. No organomegaly or mass.  EXTREMITIES: No pedal edema, cyanosis, or clubbing.  NEUROLOGIC: Cranial nerves II through XII are intact. Muscle strength 5/5 in all extremities. Sensation intact. Gait not checked.  PSYCHIATRIC: The patient is alert and oriented x 3.  SKIN: No obvious rash, lesion, or ulcer.   ORDERS/RESULTS REVIEWED:   CBC  Recent Labs Lab 10/23/15 1022 10/23/15 1208 10/24/15 0618 10/24/15 1514  WBC 8.3  --  7.2  --   HGB 14.5 13.7 12.5* 12.0*  HCT 43.2 41.4 36.6*  --   PLT 194  --  168  --   MCV 95.0  --  94.2  --   MCH 31.8  --  32.1  --   MCHC 33.5  --  34.1  --   RDW 14.8*  --  14.8*  --   LYMPHSABS 1.6  --   --   --   MONOABS 0.7  --   --   --   EOSABS 0.1  --   --   --   BASOSABS 0.1  --   --   --     ------------------------------------------------------------------------------------------------------------------  Chemistries   Recent Labs Lab 10/23/15 1022 10/24/15 0618 10/24/15 1514  NA 142 145 143  K 3.3* 3.0* 2.8*  CL 107 112* 112*  CO2 27 23 25   GLUCOSE 94 97 101*  BUN 33* 31* 28*  CREATININE 1.58* 1.35* 1.31*  CALCIUM 9.2 8.7* 8.4*  AST 21  --   --   ALT 12*  --   --   ALKPHOS 60  --   --   BILITOT 0.8  --   --    ------------------------------------------------------------------------------------------------------------------ estimated creatinine clearance is 34.9 mL/min (by C-G formula based on Cr of 1.31). ------------------------------------------------------------------------------------------------------------------ No results for input(s): TSH, T4TOTAL, T3FREE, THYROIDAB in the last 72 hours.  Invalid input(s): FREET3  Cardiac Enzymes  Recent Labs Lab 10/23/15 1022  TROPONINI <0.03   ------------------------------------------------------------------------------------------------------------------ Invalid input(s): POCBNP ---------------------------------------------------------------------------------------------------------------  RADIOLOGY: No results found.  EKG:  Orders placed or performed in visit on 10/02/14  . EKG 12-Lead    ASSESSMENT AND PLAN:  Principal Problem:   Rectal bleed Active Problems:   Acute posthemorrhagic anemia   Hypokalemia   Malignant essential hypertension   CKD (chronic kidney disease) stage 3, GFR 30-59 ml/min 1. Rectal bleed, likley hemorrhoidal vs diverticular, subsided now, HGb is  stable, now on colace and senna, gastroenterologist saw patient in consultation, but did not recommend any intervention , but medical therapy, due to dementia. 2. Acute posthemorrhagic anemia, no need to transfuse, Fe supplementation upon dc home 3. Dementia with agitation, now on geodon and seroquel, getting EKG 4. Hypokalemia,  supplementing intravenously, follow level, check Mg  5. renal insufficiency, chronic CKD st 3, improved on IVF, now stable, likely mild volume deletion related   Management plans discussed with the patient, family and they are in agreement.   DRUG ALLERGIES:  Allergies  Allergen Reactions  . Macrolides And Ketolides Hives and Other (See Comments)    agitation    CODE STATUS:     Code Status Orders        Start     Ordered   10/23/15 1431  Do not attempt resuscitation (DNR)   Continuous    Question Answer Comment  In the event of cardiac or respiratory ARREST Do not call a "code blue"   In the event of cardiac or respiratory ARREST Do not perform Intubation, CPR, defibrillation or ACLS   In the event of cardiac or respiratory ARREST Use medication by any route, position, wound care, and other measures to relive pain and suffering. May use oxygen, suction and manual treatment of airway obstruction as needed for comfort.      10/23/15 1430    Code Status History    Date Active Date Inactive Code Status Order ID Comments User Context   10/23/2015  2:10 PM 10/23/2015  2:30 PM Full Code [id number]  Katha HammingSnehalatha Konidena, MD ED      TOTAL TIME TAKING CARE OF THIS PATIENT: 80  minutes.  80  Katharina CaperVAICKUTE,Todd Argabright M.D on 10/24/2015 at 4:16 PM  Between 7am to 6pm - Pager - [phone]  After 6pm go to [email or web address] - password [email or web address] [email or web address]  [place] [facility]  Office  [phone]  CC: Primary care physician; No primary care provider on file.

## 2015-10-24 NOTE — Progress Notes (Signed)
Patient pulled out IV, became combative when reinsertion was attempted.

## 2015-10-24 NOTE — Care Management Note (Signed)
Case Management Note  Patient Details  Name: Gregory Oconnor MRN: [id number] Date of Birth: 20-Feb-1924  Subjective/Objective:       [name] was provided with a list of private pay home health agencies  From which to choose if [name] needs long term nurse aid assistance. [name] already has nurse aides coming to the house 2 days per week but she feels that she needs additional health.         Action/Plan:   Expected Discharge Date:  10/25/15               Expected Discharge Plan:     In-House Referral:     Discharge planning Services     Post Acute Care Choice:    Choice offered to:     DME Arranged:    DME Agency:     HH Arranged:    HH Agency:     Status of Service:     Medicare Important Message Given:    Date Medicare IM Given:    Medicare IM give by:    Date Additional Medicare IM Given:    Additional Medicare Important Message give by:     If discussed at Long Length of Stay Meetings, dates discussed:    Additional Comments:  Odena Mcquaid A, RN 10/24/2015, 10:02 AM

## 2015-10-24 NOTE — Progress Notes (Addendum)
Called received from lab regarding pt's critical lab of  potassium 2.8. Paged Dr.Vaickute. Waiting for callback.  Call back received from Dr.V - updated with pt's potassium level of 2.8. MD to place new orders.

## 2015-10-25 DIAGNOSIS — G300 Alzheimer's disease with early onset: Secondary | ICD-10-CM | POA: Diagnosis not present

## 2015-10-25 DIAGNOSIS — F02818 Dementia in other diseases classified elsewhere, unspecified severity, with other behavioral disturbance: Secondary | ICD-10-CM | POA: Insufficient documentation

## 2015-10-25 DIAGNOSIS — F0281 Dementia in other diseases classified elsewhere with behavioral disturbance: Secondary | ICD-10-CM | POA: Diagnosis not present

## 2015-10-25 DIAGNOSIS — L8990 Pressure ulcer of unspecified site, unspecified stage: Secondary | ICD-10-CM | POA: Insufficient documentation

## 2015-10-25 LAB — BASIC METABOLIC PANEL
Anion gap: 9 (ref 5–15)
BUN: 21 mg/dL — AB (ref 6–20)
CHLORIDE: 110 mmol/L (ref 101–111)
CO2: 23 mmol/L (ref 22–32)
Calcium: 8.7 mg/dL — ABNORMAL LOW (ref 8.9–10.3)
Creatinine, Ser: 1.3 mg/dL — ABNORMAL HIGH (ref 0.61–1.24)
GFR calc Af Amer: 54 mL/min — ABNORMAL LOW (ref >60)
GFR calc non Af Amer: 47 mL/min — ABNORMAL LOW (ref >60)
GLUCOSE: 106 mg/dL — AB (ref 65–99)
POTASSIUM: 2.7 mmol/L — AB (ref 3.5–5.1)
Sodium: 142 mmol/L (ref 135–145)

## 2015-10-25 LAB — HEMOGLOBIN: Hemoglobin: 12.8 g/dL — ABNORMAL LOW (ref 13.0–18.0)

## 2015-10-25 MED ORDER — LORAZEPAM 2 MG/ML IJ SOLN
0.5000 mg | Freq: Four times a day (QID) | INTRAMUSCULAR | Status: DC | PRN
  Administered 2015-10-25: 0.5 mg via INTRAMUSCULAR
  Filled 2015-10-25: qty 1

## 2015-10-25 MED ORDER — LORAZEPAM 1 MG PO TABS: 1.0000 mg | ORAL_TABLET | Freq: Four times a day (QID) | ORAL | Status: DC | PRN

## 2015-10-25 MED ORDER — QUETIAPINE FUMARATE 25 MG PO TABS
25.0000 mg | ORAL_TABLET | Freq: Three times a day (TID) | ORAL | Status: DC | PRN
  Administered 2015-10-25 – 2015-10-26 (×2): 25 mg via ORAL
  Filled 2015-10-25 (×2): qty 1

## 2015-10-25 MED ORDER — ESCITALOPRAM OXALATE 10 MG PO TABS
5.0000 mg | ORAL_TABLET | Freq: Every day | ORAL | Status: DC
  Administered 2015-10-25 – 2015-10-26 (×2): 5 mg via ORAL
  Filled 2015-10-25 (×2): qty 1

## 2015-10-25 MED ORDER — POTASSIUM CHLORIDE 10 MEQ/100ML IV SOLN
10.0000 meq | INTRAVENOUS | Status: AC
  Administered 2015-10-25 (×4): 10 meq via INTRAVENOUS
  Filled 2015-10-25 (×4): qty 100

## 2015-10-25 MED ORDER — MAGNESIUM SULFATE 4 GM/100ML IV SOLN
4.0000 g | Freq: Once | INTRAVENOUS | Status: AC
  Administered 2015-10-25: 4 g via INTRAVENOUS
  Filled 2015-10-25: qty 100

## 2015-10-25 MED ORDER — DIAZEPAM 5 MG/ML IJ SOLN
5.0000 mg | Freq: Four times a day (QID) | INTRAMUSCULAR | Status: DC | PRN
  Administered 2015-10-25: 5 mg via INTRAVENOUS
  Filled 2015-10-25: qty 2

## 2015-10-25 NOTE — Clinical Social Work Note (Signed)
Clinical Social Work Assessment  Patient Details  Name: Pearline Cablesugene J Honaker MRN: [id number] Date of Birth: [birth year]  Date of referral:  10/25/15               Reason for consult:  Discharge Planning                Permission sought to share information with:    Permission granted to share information::     Name::        Agency::     Relationship::     Contact Information:     Housing/Transportation Living arrangements for the past 2 months:  Single Family Home Source of Information:  Spouse Patient Interpreter Needed:  None Criminal Activity/Legal Involvement Pertinent to Current Situation/Hospitalization:  No - Comment as needed Significant Relationships:    Lives with:  Spouse Do you feel safe going back to the place where you live?    Need for family participation in patient care:     Care giving concerns:  Patient lives with wife at their home.   Social Worker assessment / plan:  CSW received consult to see patient regarding discharge planning. Currently staff continues to document that patient is having combative/agitated behaviors in which he is attempting to kick and hit staff. Psych has been consulted to assess. CSW contacted patient's wife via phone: Theodis SatoDiane Wiersma: [phone]. Patient's wife was very hurried on the phone with CSW and stated she was waiting on her ride. Patient's wife stated that she is having her hip replaced soon and will need to have a temporary place for patient to go but is not sure she wishes for him to be placed from the hospital. CSW informed patient's wife that patient's behaviors would have to be addressed before any type of placement could be considered. Patient's wife stated that patient is not as combative at home and that she is able to manage him for the most part. Patient is ambulatory at home but he is incontinent. Patient's wife went back and forth on whether she wanted CSW to assist with any type of memory care placement (as this was the  type of placement she stated patient would require). Patient's wife stated she would think about it and did not want to decide on anything now. When CSW inquired as to what her end goal for patient was, she stated "to take him home." CSW will continue to follow and await psych input.  Employment status:  Retired Database administratornsurance information:  Managed Medicare PT Recommendations:  Not assessed at this time Information / Referral to community resources:     Patient/Family's Response to care:  Patient's wife expressed appreciation for CSW phone call but sounded very rushed on the phone.  Patient/Family's Understanding of and Emotional Response to Diagnosis, Current Treatment, and Prognosis:  Patient's wife seems overwhelmed and unsure about what her plan is for patient.  Emotional Assessment Appearance:  Appears stated age Attitude/Demeanor/Rapport:  Aggressive (Verbally and/or physically), Combative Affect (typically observed):  Agitated Orientation:  Oriented to Self Alcohol / Substance use:  Not Applicable Psych involvement (Current and /or in the community):  Yes (Comment)  Discharge Needs  Concerns to be addressed:  Care Coordination Readmission within the last 30 days:  No Current discharge risk:  Cognitively Impaired Barriers to Discharge:  No Barriers Identified   York SpanielMonica Ransom Nickson, LCSW 10/25/2015, 1:09 PM

## 2015-10-25 NOTE — Progress Notes (Signed)
Call received from lab regarding pt's critical lab- potassium level of 2.7 Paged Dr.Vaickute. Call back received updated with pt's potassium level of 2.7. MD to place new order.

## 2015-10-25 NOTE — Consult Note (Signed)
[facility] Consult   Reason for Consult:  Dementia and agitation Referring Physician:   Patient Identification: Gregory Oconnor MRN:  [id number] Principal Diagnosis: Rectal bleed Diagnosis:   Patient Active Problem List   Diagnosis Date Noted  . Pressure ulcer [L89.90] 10/25/2015  . Hypokalemia [E87.6] 10/24/2015  . Malignant essential hypertension [I10] 10/24/2015  . CKD (chronic kidney disease) stage 3, GFR 30-59 ml/min [N18.3] 10/24/2015  . Acute posthemorrhagic anemia [D62] 10/24/2015  . Rectal bleed [K62.5] 10/23/2015    Total Time spent with patient: 45 minutes  Subjective:   Gregory Oconnor is a 82 y.o. male patient admitted with severe rectal bleeding. He has history of severe dementia, and lives with his wife at home  HPI:   Most of the history was obtained from the chart as well as interview of the patient who was unable to provide any history due to his severe dementia. Collateral information was obtained from the staff. I also tried to reach his wife at the 2 numbers listed in the chart but she did not pick up the phone. According to the records patient was brought to the hospital yesterday as his wife noticed rectal bleed and his diaper was full of blood. Patient has last bowel movements 2 days ago. Patient has episodes of constipation. He also has history of similar dementia. He was unable to provide any history. According to the wife patient was very agitated earlier and has received Seroquel. According to his wife he has history of severe dementia and agitation spells. She gives him on Seroquel as well as Geodon at home. It is not sure who has been prescribing him these medications. He is also on Namenda at this time. However his QTc interval is currently prolonged. During my interview patient remains agitated and was unable to provide any coherent history. He did not answer any questions. Staff reported that he is more agitated today. He was more  calm and alert yesterday and was able to respond to the medications.  Past Psychiatric History:  Patient has history of dementia and is currently taking Namenda and Seroquel and Geodon.  Risk to Self: Is patient at risk for suicide?: No Risk to Others:   Prior Inpatient Therapy:   Prior Outpatient Therapy:    Past Medical History:  Past Medical History  Diagnosis Date  . GI bleed   . Dementia   . Hypertension    History reviewed. No pertinent past surgical history. Family History: History reviewed. No pertinent family history. Family Psychiatric  History: None noted Social History:  History  Alcohol Use No     History  Drug Use No    Social History   Social History  . Marital Status: Married    Spouse Name: N/A  . Number of Children: N/A  . Years of Education: N/A   Social History Main Topics  . Smoking status: Never Smoker   . Smokeless tobacco: None  . Alcohol Use: No  . Drug Use: No  . Sexual Activity: Not Asked   Other Topics Concern  . None   Social History Narrative  . None   Additional Social History:        lives with his wife and she takes care of him at home.                   Allergies:   Allergies  Allergen Reactions  . Macrolides And Ketolides Hives and Other (See Comments)    agitation  Labs:  Results for orders placed or performed during the hospital encounter of 10/23/15 (from the past 48 hour(s))  Basic metabolic panel     Status: Abnormal   Collection Time: 10/24/15  6:18 AM  Result Value Ref Range   Sodium 145 135 - 145 mmol/L   Potassium 3.0 (L) 3.5 - 5.1 mmol/L   Chloride 112 (H) 101 - 111 mmol/L   CO2 23 22 - 32 mmol/L   Glucose, Bld 97 65 - 99 mg/dL   BUN 31 (H) 6 - 20 mg/dL   Creatinine, Ser 1.35 (H) 0.61 - 1.24 mg/dL   Calcium 8.7 (L) 8.9 - 10.3 mg/dL   GFR calc non Af Amer 45 (L) >60 mL/min   GFR calc Af Amer 52 (L) >60 mL/min    Comment: (NOTE) The eGFR has been calculated using the CKD EPI equation. This  calculation has not been validated in all clinical situations. eGFR's persistently <60 mL/min signify possible Chronic Kidney Disease.    Anion gap 10 5 - 15  CBC     Status: Abnormal   Collection Time: 10/24/15  6:18 AM  Result Value Ref Range   WBC 7.2 3.8 - 10.6 K/uL   RBC 3.88 (L) 4.40 - 5.90 MIL/uL   Hemoglobin 12.5 (L) 13.0 - 18.0 g/dL   HCT 36.6 (L) 40.0 - 52.0 %   MCV 94.2 80.0 - 100.0 fL   MCH 32.1 26.0 - 34.0 pg   MCHC 34.1 32.0 - 36.0 g/dL   RDW 14.8 (H) 11.5 - 14.5 %   Platelets 168 150 - 440 K/uL  Basic metabolic panel     Status: Abnormal   Collection Time: 10/24/15  3:14 PM  Result Value Ref Range   Sodium 143 135 - 145 mmol/L   Potassium 2.8 (LL) 3.5 - 5.1 mmol/L    Comment: CRITICAL RESULT CALLED TO, READ BACK BY AND VERIFIED WITH  BHAVINI PATEL AT 1614 10/24/15 SDR    Chloride 112 (H) 101 - 111 mmol/L   CO2 25 22 - 32 mmol/L   Glucose, Bld 101 (H) 65 - 99 mg/dL   BUN 28 (H) 6 - 20 mg/dL   Creatinine, Ser 1.31 (H) 0.61 - 1.24 mg/dL   Calcium 8.4 (L) 8.9 - 10.3 mg/dL   GFR calc non Af Amer 47 (L) >60 mL/min   GFR calc Af Amer 54 (L) >60 mL/min    Comment: (NOTE) The eGFR has been calculated using the CKD EPI equation. This calculation has not been validated in all clinical situations. eGFR's persistently <60 mL/min signify possible Chronic Kidney Disease.    Anion gap 6 5 - 15  Hemoglobin     Status: Abnormal   Collection Time: 10/24/15  3:14 PM  Result Value Ref Range   Hemoglobin 12.0 (L) 13.0 - 18.0 g/dL  Magnesium     Status: None   Collection Time: 10/24/15  3:14 PM  Result Value Ref Range   Magnesium 1.9 1.7 - 2.4 mg/dL  Potassium     Status: Abnormal   Collection Time: 10/24/15 10:54 PM  Result Value Ref Range   Potassium 3.3 (L) 3.5 - 5.1 mmol/L  Basic metabolic panel     Status: Abnormal   Collection Time: 10/25/15  9:06 AM  Result Value Ref Range   Sodium 142 135 - 145 mmol/L   Potassium 2.7 (LL) 3.5 - 5.1 mmol/L    Comment: CRITICAL  RESULT CALLED TO, READ BACK BY AND VERIFIED WITH BHAVINI  PATEL AT 0938 ON 10/25/15.Marland KitchenMarland KitchenSon[facility] Medical Center    Chloride 110 101 - 111 mmol/L   CO2 23 22 - 32 mmol/L   Glucose, Bld 106 (H) 65 - 99 mg/dL   BUN 21 (H) 6 - 20 mg/dL   Creatinine, Ser 1.30 (H) 0.61 - 1.24 mg/dL   Calcium 8.7 (L) 8.9 - 10.3 mg/dL   GFR calc non Af Amer 47 (L) >60 mL/min   GFR calc Af Amer 54 (L) >60 mL/min    Comment: (NOTE) The eGFR has been calculated using the CKD EPI equation. This calculation has not been validated in all clinical situations. eGFR's persistently <60 mL/min signify possible Chronic Kidney Disease.    Anion gap 9 5 - 15  Hemoglobin     Status: Abnormal   Collection Time: 10/25/15  9:06 AM  Result Value Ref Range   Hemoglobin 12.8 (L) 13.0 - 18.0 g/dL    Current Facility-Administered Medications  Medication Dose Route Frequency Provider Last Rate Last Dose  . acetaminophen (TYLENOL) tablet 650 mg  650 mg Oral Q6H PRN Epifanio Lesches, MD       Or  . acetaminophen (TYLENOL) suppository 650 mg  650 mg Rectal Q6H PRN Epifanio Lesches, MD      . alum & mag hydroxide-simeth (MAALOX/MYLANTA) 200-200-20 MG/5ML suspension 30 mL  30 mL Oral Q6H PRN Epifanio Lesches, MD      . amLODipine (NORVASC) tablet 5 mg  5 mg Oral Daily Epifanio Lesches, MD   5 mg at 10/25/15 0917  . dextrose 5 % and 0.45 % NaCl with KCl 20 mEq/L infusion   Intravenous Continuous Theodoro Grist, MD 50 mL/hr at 10/25/15 0615    . docusate (COLACE) 50 MG/5ML liquid 100 mg  100 mg Oral BID Lenis Noon, RPH   100 mg at 10/25/15 1829  . donepezil (ARICEPT) tablet 10 mg  10 mg Oral QHS Epifanio Lesches, MD   10 mg at 10/24/15 2114  . escitalopram (LEXAPRO) tablet 5 mg  5 mg Oral Daily Rainey Pines, MD      . ferrous sulfate 220 (44 Fe) MG/5ML solution 220 mg  220 mg Oral Q breakfast Theodoro Grist, MD   220 mg at 10/25/15 0750  . hydrALAZINE (APRESOLINE) injection 10 mg  10 mg Intravenous Q6H PRN Epifanio Lesches, MD   10 mg at  10/25/15 0710  . isosorbide mononitrate (IMDUR) 24 hr tablet 120 mg  120 mg Oral Daily Epifanio Lesches, MD   120 mg at 10/25/15 0916  . LORazepam (ATIVAN) tablet 1 mg  1 mg Oral Q6H PRN Rainey Pines, MD       Or  . LORazepam (ATIVAN) injection 0.5 mg  0.5 mg Intramuscular Q6H PRN Rainey Pines, MD      . nitroGLYCERIN (NITROSTAT) SL tablet 0.4 mg  0.4 mg Sublingual Q5 min PRN Epifanio Lesches, MD      . ondansetron (ZOFRAN) tablet 4 mg  4 mg Oral Q6H PRN Epifanio Lesches, MD       Or  . ondansetron (ZOFRAN) injection 4 mg  4 mg Intravenous Q6H PRN Epifanio Lesches, MD      . potassium chloride 10 mEq in 100 mL IVPB  10 mEq Intravenous Q1 Hr x 4 Theodoro Grist, MD   10 mEq at 10/25/15 1139  . QUEtiapine (SEROQUEL) tablet 25 mg  25 mg Oral TID PRN Rainey Pines, MD      . senna (SENOKOT) tablet 8.6 mg  1 tablet Oral Daily PRN Rima  Ether Griffins, MD      . vitamin B-12 (CYANOCOBALAMIN) tablet 1,000 mcg  1,000 mcg Oral Daily Epifanio Lesches, MD   1,000 mcg at 10/25/15 0917    Musculoskeletal: Strength & Muscle Tone: decreased Gait & Station: unsteady Patient leans: N/A  Psychiatric Specialty Exam: Review of Systems  Psychiatric/Behavioral: Positive for hallucinations.    Blood pressure 129/73, pulse 103, temperature 97.5 F (36.4 C), temperature source Oral, resp. rate 20, height _0  (1.778 m), weight 139 lb 11.2 oz (63.368 kg), SpO2 98 %.Body mass index is 20.05 kg/(m^2).  General Appearance: Disheveled  Eye Contact::  Poor  Speech:  Garbled  Volume:  Decreased  Mood:  Anxious and Depressed  Affect:  Congruent  Thought Process:  Disorganized and Tangential  Orientation:  Other:  not oriented to time, place or person.   Thought Content:  Hallucinations: None  Suicidal Thoughts:  No  Homicidal Thoughts:  No  Memory:  impaired  Judgement:  Impaired  Insight:  Lacking  Psychomotor Activity:  Psychomotor Retardation  Concentration:  Poor  Recall:  Poor  Fund of  Knowledge:Poor  Language: Poor  Akathisia:  No  Handed:  Right  AIMS (if indicated):     Assets:  Social Support  ADL's:  Impaired  Cognition: Impaired,  Severe  Sleep:      Treatment Plan Summary: Medication management  Disposition: No evidence of imminent risk to self or others at present.    I will adjust his medications as follows Discontinue Geodon due to prolonged QTC Change Seroquel 25 mg by mouth 3 times a day when necessary for his agitation behavioral problems Advised that that it can be mixed with the ice cream or the applesauce if the patient is not receptive to taking medications Start him on Lexapro 5 mg by mouth daily for his behavioral issues I will start him on Lexapro 5 mg daily for his behavioral issues DC Valium Start him on lorazepam 1 mg every 6 hours when necessary for his agitation Continue Namenda 10 mg twice a day Patient needs to be placed in a facility for his behavioral issues and social work to discuss with the family about the placement with his wife and family members Thank you for allowing me to participate in the care of this patient  Rainey Pines, MD  10/25/2015 1:24 PM

## 2015-10-25 NOTE — Consult Note (Signed)
GI Inpatient Follow-up Note  Patient Identification: Gregory Oconnor is a 82 y.o. male with severe dementia, history of diverticular bleeds, admitted for rectal bleeding.  Subjective: Safety sitter reports a diaper changed earlier for urine had a small amount of blood on it.  She also saw some fresh blood on his bottom, again small amount, unable to determine dark or bright red due to patient being uncooperative.  No BM's noted.  Hgb improved to 12.8.  Scheduled Inpatient Medications:  . amLODipine  5 mg Oral Daily  . docusate  100 mg Oral BID  . donepezil  10 mg Oral QHS  . ferrous sulfate  220 mg Oral Q breakfast  . isosorbide mononitrate  120 mg Oral Daily  . potassium chloride  10 mEq Intravenous Q1 Hr x 4  . QUEtiapine  25-50 mg Oral QHS  . vitamin B-12  1,000 mcg Oral Daily  . ziprasidone  20 mg Oral BID WC    Continuous Inpatient Infusions:   . dextrose 5 % and 0.45 % NaCl with KCl 20 mEq/L 50 mL/hr at 10/25/15 0615    PRN Inpatient Medications:  acetaminophen **OR** acetaminophen, alum & mag hydroxide-simeth, diazepam, hydrALAZINE, nitroGLYCERIN, ondansetron **OR** ondansetron (ZOFRAN) IV, senna  Review of Systems: Did not assess due to condition.   Physical Examination: BP 129/73 mmHg  Pulse 103  Temp(Src) 97.5 F (36.4 C) (Oral)  Resp 20  Ht 5\' 10"  (1.778 m)  Wt 63.368 kg (139 lb 11.2 oz)  BMI 20.05 kg/m2  SpO2 98% AVW:[id number]:aggitated. Did not complete PE due to combativeness  Data: Lab Results  Component Value Date   WBC 7.2 10/24/2015   HGB 12.8* 10/25/2015   HCT 36.6* 10/24/2015   MCV 94.2 10/24/2015   PLT 168 10/24/2015    Recent Labs Lab 10/24/15 0618 10/24/15 1514 10/25/15 0906  HGB 12.5* 12.0* 12.8*   Lab Results  Component Value Date   NA 142 10/25/2015   K 2.7* 10/25/2015   CL 110 10/25/2015   CO2 23 10/25/2015   BUN 21* 10/25/2015   CREATININE 1.30* 10/25/2015   Lab Results  Component Value Date   ALT 12* 10/23/2015   AST  21 10/23/2015   ALKPHOS 60 10/23/2015   BILITOT 0.8 10/23/2015    Recent Labs Lab 10/23/15 1022  APTT 34  INR 1.04   Assessment/Plan: Mr. Gregory Oconnor is a 82 y.o. male with severe dementia with combativeness, history of diverticular bleeding. small amount of blood noted in urine diaper, small amount of fresh blood noted on his bottom. Hgb improving from  12.5 to 12.8. Elevated lipase on admission of 98  Recommendations: No new GI recommendations at this time.  We will continue to follow with you. Please call with questions or concerns.  Carney Harderari S Richards, PA-C  I personally performed these services.

## 2015-10-25 NOTE — Progress Notes (Signed)
[facility] [place] [facility]   PATIENT NAME: Gregory Cooperugene Oconnor    MR#:  [id number]  DATE OF BIRTH:  [birth year]  SUBJECTIVE:  CHIEF COMPLAINT:   Chief Complaint  Patient presents with  . Abdominal Pain  . Rectal Bleeding   patient is agitated today, even though Geodon dose was given in the morning. Pushing examiner away. EKG revealed a QTc interval of 540 ms. Psychiatrist consultation was obtained for agitation management. Geodon is discontinued and Seroquel dose was advanced to 3 times daily, adding lorazepam and Lexapro. Family is to discuss with care management, possible placement   Review of Systems  Unable to perform ROS: dementia    VITAL SIGNS: Blood pressure 129/73, pulse 103, temperature 97.5 F (36.4 C), temperature source Oral, resp. rate 20, height 5\' 10"  (1.778 m), weight 63.368 kg (139 lb 11.2 oz), SpO2 98 %.  PHYSICAL EXAMINATION:   GENERAL:  82 y.o.-year-old patient lying in the bed in mild to moderate distress, restless and trying to get out from bed, pushing examiner away,  hitting with a fist.   EYES: Pupils equal, round, reactive to light and accommodation. No scleral icterus. Extraocular muscles intact.  HEENT: Head atraumatic, normocephalic. Oropharynx and nasopharynx clear.  NECK:  Supple, no jugular venous distention. No thyroid enlargement, no tenderness.  LUNGS: Normal breath sounds bilaterally, no wheezing, rales,rhonchi or crepitation. No use of accessory muscles of respiration.  CARDIOVASCULAR: S1, S2 normal. No murmurs, rubs, or gallops.  ABDOMEN: Soft, nontender, nondistended. Bowel sounds present. No organomegaly or mass.  EXTREMITIES: No pedal edema, cyanosis, or clubbing.  NEUROLOGIC: Cranial nerves II through XII are intact. Muscle strength 5/5 in all extremities. Sensation intact. Gait not checked.  PSYCHIATRIC: The patient is alert , confused, agitated. Nonverbal SKIN: No obvious rash, lesion, or ulcer.    ORDERS/RESULTS REVIEWED:   CBC  Recent Labs Lab 10/23/15 1022 10/23/15 1208 10/24/15 0618 10/24/15 1514 10/25/15 0906  WBC 8.3  --  7.2  --   --   HGB 14.5 13.7 12.5* 12.0* 12.8*  HCT 43.2 41.4 36.6*  --   --   PLT 194  --  168  --   --   MCV 95.0  --  94.2  --   --   MCH 31.8  --  32.1  --   --   MCHC 33.5  --  34.1  --   --   RDW 14.8*  --  14.8*  --   --   LYMPHSABS 1.6  --   --   --   --   MONOABS 0.7  --   --   --   --   EOSABS 0.1  --   --   --   --   BASOSABS 0.1  --   --   --   --    ------------------------------------------------------------------------------------------------------------------  Chemistries   Recent Labs Lab 10/23/15 1022 10/24/15 0618 10/24/15 1514 10/24/15 2254 10/25/15 0906  NA 142 145 143  --  142  K 3.3* 3.0* 2.8* 3.3* 2.7*  CL 107 112* 112*  --  110  CO2 27 23 25   --  23  GLUCOSE 94 97 101*  --  106*  BUN 33* 31* 28*  --  21*  CREATININE 1.58* 1.35* 1.31*  --  1.30*  CALCIUM 9.2 8.7* 8.4*  --  8.7*  MG  --   --  1.9  --   --   AST 21  --   --   --   --  ALT 12*  --   --   --   --   ALKPHOS 60  --   --   --   --   BILITOT 0.8  --   --   --   --    ------------------------------------------------------------------------------------------------------------------ estimated creatinine clearance is 34.5 mL/min (by C-G formula based on Cr of 1.3). ------------------------------------------------------------------------------------------------------------------ No results for input(s): TSH, T4TOTAL, T3FREE, THYROIDAB in the last 72 hours.  Invalid input(s): FREET3  Cardiac Enzymes  Recent Labs Lab 10/23/15 1022  TROPONINI <0.03   ------------------------------------------------------------------------------------------------------------------ Invalid input(s): POCBNP ---------------------------------------------------------------------------------------------------------------  RADIOLOGY: No results found.  EKG:   Orders placed or performed in visit on 10/02/14  . 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 Rectal bleed, likley hemorrhoidal vs diverticular, resolved now, HGb is stable, now on colace and senna, gastroenterologist saw patient in consultation, but did not recommend any intervention , but medical therapy, due to dementia. 2. Acute posthemorrhagic anemia, no need to transfuse, Fe supplementation upon dc home 3. Dementia with agitation, appreciate psychiatrist input, now on advanced doses of seroquel, lorazepam, Lexapro , off Geodon due to prolonged QTc interval. We'll discuss with patient's family placement if they are interested 4. Hypokalemia, supplementing intravenously, follow level, low normal Mg , supplementing intravenously as well. Following tomorrow morning 5. renal insufficiency, chronic CKD st 3, improved on IVF, now stable, likely mild volume depletion related   Management plans discussed with the patient, family and they are in agreement.   DRUG ALLERGIES:  Allergies  Allergen Reactions  . Macrolides And Ketolides Hives and Other (See Comments)    agitation    CODE STATUS:     Code Status Orders        Start     Ordered   10/23/15 1431  Do not attempt resuscitation (DNR)   Continuous    Question Answer Comment  In the event of cardiac or respiratory ARREST Do not call a "code blue"   In the event of cardiac or respiratory ARREST Do not perform Intubation, CPR, defibrillation or ACLS   In the event of cardiac or respiratory ARREST Use medication by any route, position, wound care, and other measures to relive pain and suffering. May use oxygen, suction and manual treatment of airway  obstruction as needed for comfort.      10/23/15 1430    Code Status History    Date Active Date Inactive Code Status Order ID Comments User Context   10/23/2015  2:10 PM 10/23/2015  2:30 PM Full Code [id number]  Katha Hamming, MD ED      TOTAL TIME TAKING CARE OF THIS PATIENT: 30 minutes.  Discussed with patient's daughter  Katharina Caper M.D on 10/25/2015 at 1:37 PM  Between 7am to 6pm - Pager - [phone]  After 6pm go to [email or web address] - password [email or web address] [facility]  [facility]  Office  [phone]  CC: Primary care physician; No primary care provider on file.

## 2015-10-25 NOTE — Progress Notes (Signed)
PT REMAINS AGITATED AND KICKING FAMILY AND STAFF. DR VAICKUTE NOTIFIED. PSYCHE CONSULT SUGGESTED

## 2015-10-26 LAB — BASIC METABOLIC PANEL
ANION GAP: 9 (ref 5–15)
BUN: 19 mg/dL (ref 6–20)
CHLORIDE: 112 mmol/L — AB (ref 101–111)
CO2: 20 mmol/L — AB (ref 22–32)
Calcium: 8.5 mg/dL — ABNORMAL LOW (ref 8.9–10.3)
Creatinine, Ser: 1.2 mg/dL (ref 0.61–1.24)
GFR calc non Af Amer: 52 mL/min — ABNORMAL LOW (ref >60)
GFR, EST AFRICAN AMERICAN: 60 mL/min — AB (ref >60)
GLUCOSE: 102 mg/dL — AB (ref 65–99)
POTASSIUM: 3.5 mmol/L (ref 3.5–5.1)
Sodium: 141 mmol/L (ref 135–145)

## 2015-10-26 LAB — HEMOGLOBIN: Hemoglobin: 12.7 g/dL — ABNORMAL LOW (ref 13.0–18.0)

## 2015-10-26 MED ORDER — FERROUS SULFATE 220 (44 FE) MG/5ML PO ELIX: 220.0000 mg | ORAL_SOLUTION | Freq: Every day | ORAL | Status: DC

## 2015-10-26 MED ORDER — DOCUSATE SODIUM 50 MG/5ML PO LIQD: 100.0000 mg | Freq: Two times a day (BID) | ORAL | Status: DC

## 2015-10-26 MED ORDER — QUETIAPINE FUMARATE 25 MG PO TABS: 12.5000 mg | ORAL_TABLET | Freq: Three times a day (TID) | ORAL | Status: AC | PRN

## 2015-10-26 MED ORDER — QUETIAPINE FUMARATE 25 MG PO TABS: 12.5000 mg | ORAL_TABLET | Freq: Three times a day (TID) | ORAL | Status: DC | PRN

## 2015-10-26 MED ORDER — QUETIAPINE FUMARATE 25 MG PO TABS: 25.0000 mg | ORAL_TABLET | Freq: Three times a day (TID) | ORAL | Status: DC | PRN

## 2015-10-26 MED ORDER — SENNA 8.6 MG PO TABS: 1.0000 | ORAL_TABLET | Freq: Every day | ORAL | Status: DC | PRN

## 2015-10-26 MED ORDER — ESCITALOPRAM OXALATE 5 MG PO TABS: 5.0000 mg | ORAL_TABLET | Freq: Every day | ORAL | Status: AC

## 2015-10-26 MED ORDER — ESCITALOPRAM OXALATE 5 MG PO TABS: 5.0000 mg | ORAL_TABLET | Freq: Every day | ORAL | Status: DC

## 2015-10-26 NOTE — Progress Notes (Signed)
Physical Therapy Evaluation Patient Details Name: Gregory Oconnor MRN: [id number] DOB: 08-01-1924 Today's Date: 10/26/2015   History of Present Illness  Gregory Oconnor is a 82 y.o. male with a known history of htn,severe dementia, brought in by the wife because she noticed rectal bleeding and his diaper was full of blood. It happened today morning. He was fine yesterday. No fever. No nausea, no vomiting. Last BM was 2 days ago. Has episodes of constipation. Patient has severe dementia and unable to give me any history.,He had one Episode of small bloody BM in ER>. Was very agitated earlier and received a Seroquel. And wife is at bedside. According to her has severe dementia and agitation spells and she has a home health nurse that helps her.  Clinical Impression  Pt asleep upon PT arrival and not able to fully arouse despite repeated stimulation from PT, NA, and wife. Pt became increasingly agitated especially during diaper change with extensive hygiene care requiring +2 person assist.  Pt transferred from supine to sit with Max A +2 and maintained a rigid extended posture which passed after about 30 seconds and pt able to relax and sit at EOB.  Pt refused gait belt and any attempts at standing or ambulating.  Pt eyes remained closed throughout session except for 2 brief episodes.  Pt with poor safety awareness and poor insight to deficits.  Wife at bedside and adamant about taking her husband home at discharge.  Given pt's current level of function recommend SNF for pt safety.  Pt would benefit from acute PT services to address objective findings.    Follow Up Recommendations SNF    Equipment Recommendations   (TBD)    Recommendations for Other Services       Precautions / Restrictions Precautions Precautions: Fall Restrictions Weight Bearing Restrictions: No      Mobility  Bed Mobility Overal bed mobility: +2 for physical assistance;Needs Assistance Bed Mobility: Supine to  Sit;Sit to Supine     Supine to sit: Max assist;+2 for physical assistance;HOB elevated Sit to supine: Max assist;+2 for physical assistance   General bed mobility comments: Pt very rigid, not bending at hips or knees when positioned at EOB, resisting bed mobility and transfer.  Pt eventually relaxed in supported sitting position and able to maintain midline safely.  Transfers                 General transfer comment: Unsafe to transfer at this time due to decreased arousal/attention and unsafe behavior.  Ambulation/Gait             General Gait Details: Not attempted due to decreased cognitive status, decreased arousal, and combatitiveness.  Stairs            Wheelchair Mobility    Modified Rankin (Stroke Patients Only)       Balance Overall balance assessment: Needs assistance Sitting-balance support: Feet supported Sitting balance-Leahy Scale: Fair Sitting balance - Comments: initial heavy posterior lean; able to get into midline with Max A then maintain static sitting with supervision. Postural control: Posterior lean                                   Pertinent Vitals/Pain Pain Assessment:  (Screams "Ow" when touched.)    Home Living Family/patient expects to be discharged to:: Private residence Living Arrangements: Spouse/significant other Available Help at Discharge: Personal care attendant;Available PRN/intermittently Type of Home: House Home  Access: Stairs to enter Entrance Stairs-Rails: Right Entrance Stairs-Number of Steps: 3 Home Layout: One level Home Equipment: Cane - single point      Prior Function Level of Independence: Needs assistance   Gait / Transfers Assistance Needed: able to go up/down steps by self  ADL's / Homemaking Assistance Needed: wife assists with feeding and bathing  Comments: Wife reports pt is not as agitated at home as he is in the hospital.     Hand Dominance        Extremity/Trunk  Assessment   Upper Extremity Assessment: Generalized weakness           Lower Extremity Assessment: Generalized weakness         Communication      Cognition Arousal/Alertness: Lethargic;Suspect due to medications Behavior During Therapy: Agitated;Anxious;Impulsive Overall Cognitive Status: History of cognitive impairments - at baseline Area of Impairment: Orientation;Following commands;Safety/judgement Orientation Level: Disoriented to;Time;Situation       Safety/Judgement: Decreased awareness of safety;Decreased awareness of deficits     General Comments: Unable to keep eyes open or respond appropriately to therapist throughout session; uncooperative with personal hygeine after BM, constantly grabbing NA and PT's hands to move them away; making fists and aggressive gestures.    General Comments General comments (skin integrity, edema, etc.): reddish brown BM during session    Exercises        Assessment/Plan    PT Assessment Patient needs continued PT services  PT Diagnosis Difficulty walking;Generalized weakness;Altered mental status   PT Problem List Decreased strength;Decreased activity tolerance;Decreased balance;Decreased mobility;Decreased cognition;Decreased safety awareness  PT Treatment Interventions Gait training;Functional mobility training;Therapeutic activities;Therapeutic exercise;Balance training;Cognitive remediation;Patient/family education   PT Goals (Current goals can be found in the Care Plan section) Acute Rehab PT Goals Patient Stated Goal: To walk PT Goal Formulation: With family Potential to Achieve Goals: Fair    Frequency Min 2X/week   Barriers to discharge Decreased caregiver support wife primary caregiver; pt currently requiring Max A +2 for bed mobility and hypeine    Co-evaluation               End of Session Equipment Utilized During Treatment: Gait belt Activity Tolerance: Treatment limited secondary to  agitation Patient left: in bed;with nursing/sitter in room;with family/visitor present Nurse Communication: Mobility status    Functional Assessment Tool Used: clinical judgement Functional Limitation: Mobility: Walking and moving around Mobility: Walking and Moving Around Current Status ([id number](G8978): At least 40 percent but less than 60 percent impaired, limited or restricted Mobility: Walking and Moving Around Goal Status [phone](G8979): At least 20 percent but less than 40 percent impaired, limited or restricted    Time: 1430-1510 PT Time Calculation (min) (ACUTE ONLY): 40 min   Charges:   PT Evaluation $PT Eval Moderate Complexity: 1 Procedure PT Treatments $Therapeutic Activity: 8-22 mins   PT G Codes:   PT G-Codes **NOT FOR INPATIENT CLASS** Functional Assessment Tool Used: clinical judgement Functional Limitation: Mobility: Walking and moving around Mobility: Walking and Moving Around Current Status (U9811(G8978): At least 40 percent but less than 60 percent impaired, limited or restricted Mobility: Walking and Moving Around Goal Status [id number](G8979): At least 20 percent but less than 40 percent impaired, limited or restricted    Pulte Homesisele A Zachariah Pavek, PT 10/26/2015, 3:40 PM

## 2015-10-26 NOTE — Discharge Summary (Signed)
[facility] [place] [facility]   PATIENT NAME: Gregory Oconnor    MR#:  [id number]  DATE OF BIRTH:  01-01-1924  DATE OF ADMISSION:  10/23/2015 ADMITTING PHYSICIAN: Katha HammingSnehalatha Konidena, MD  DATE OF DISCHARGE: No discharge date for patient encounter.  PRIMARY CARE PHYSICIAN: No primary care provider on file.     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 GI bleed   . Dementia   . Hypertension     .pro HOSPITAL COURSE:  Patient is 82 year old Caucasian male with past medical history significant for history of dementia with agitation, essential hypertension, gastrointestinal bleed, who presents to the hospital with complaints of bleeding from rectum. On arrival to the hospital patient was noted to be agitated and hypertensive with systolic blood pressure around 170. His hemoglobin level, however, was noted to be 13.7, drifting down to 12.7 by the day of discharge with her rehydration. Patient was initiated on IV fluids and gastroenterologist consultation was obtained and gastroenterologist felt that patient's symptoms need to be followed, but no intervention was recommended. Because of patient's advanced dementia and poor cooperation. Since patient's hemoglobin did not drop down significantly,it was felt that patient is stable to be discharged home on iron supplementation at home. Since patient was intermittently agitated and combative in the hospital, consultation with psychiatrist was obtained, who  recommended to advance Seroquel giving patient to take 25 mg of Seroquel 3 times daily as needed with bedtime dose of 25 mg. When attempt was made to give 25 mg of Seroquel at daytime, patient became very somnolent, however, after long discussions patient's family decided to take him home with home health. It is advised to decrease his Seroquel dose at daytime to 12.5 mg 3 times daily as needed, keeping 25 mg at bedtime Discussion by problem 1. Rectal bleed, likley hemorrhoidal vs diverticular, resolving, although patient still has intermittent small amount of bleeding with stool, HGb is stable, now on colace and senna, gastroenterologist saw patient in consultation, but did not recommend any intervention , but medical therapy, due to dementia. Initiated iron supplementation 2. Acute posthemorrhagic anemia, no need to transfuse, Fe supplementation upon dc home 3. Dementia with agitation, appreciate psychiatrist input, decrease seroquel dose to 12.5 milligrams 3 times daily as needed, +25 mg at bedtime, continue Lexapro , off Geodon due to prolonged QTc interval. Patient's wife is not interested in placement, patient is being discharged home with home health services 4. Hypokalemia, supplemented intravenously, level normalized, patient had low normal Mg level , supplemented intravenously as well. Following levels as outpatient intermittently 5. renal insufficiency, chronic CKD st 3, improved on IVF, likely some element of volume depletion , now stable   DISCHARGE CONDITIONS:   Stable  CONSULTS OBTAINED:  Treatment Team:  Scot Junobert T Elliott, MD  DRUG ALLERGIES:   Allergies  Allergen Reactions  . Macrolides And Ketolides Hives and Other (See Comments)    agitation    DISCHARGE MEDICATIONS:   Current Discharge Medication List    START taking these medications   Details  docusate (COLACE) 50 MG/5ML  liquid Take 10 mLs (100 mg total) by mouth 2 (two) times daily. Qty: 100 mL, Refills: 0     escitalopram (LEXAPRO) 5 MG tablet Take 1 tablet (5 mg total) by mouth daily. Qty: 30 tablet, Refills: 6    ferrous sulfate 220 (44 Fe) MG/5ML solution Take 5 mLs (220 mg total) by mouth daily with breakfast. Qty: 150 mL, Refills: 3    senna (SENOKOT) 8.6 MG TABS tablet Take 1 tablet (8.6 mg total) by mouth daily as needed for mild constipation. Qty: 120 each, Refills: 0      CONTINUE these medications which have CHANGED   Details  amLODipine (NORVASC) 10 MG tablet Take 1 tablet (10 mg total) by mouth daily. Qty: 30 tablet, Refills: 6    !! QUEtiapine (SEROQUEL) 25 MG tablet Take 1 tablet (25 mg total) by mouth at bedtime. Qty: 30 tablet, Refills: 6    !! QUEtiapine (SEROQUEL) 25 MG tablet Take 1 tablet (25 mg total) by mouth 3 (three) times daily as needed (agitation). Qty: 90 tablet, Refills: 6     !! - Potential duplicate medications found. Please discuss with provider.    CONTINUE these medications which have NOT CHANGED   Details  Cyanocobalamin (RA VITAMIN B-12 TR) 1000 MCG TBCR Take 1,000 mg by mouth daily.    donepezil (ARICEPT) 10 MG tablet Take 10 mg by mouth at bedtime.    isosorbide mononitrate (IMDUR) 120 MG 24 hr tablet Take 120 mg by mouth daily.    nitroGLYCERIN (NITROSTAT) 0.4 MG SL tablet Place 0.4 mg under the tongue every 5 (five) minutes as needed for chest pain.          DISCHARGE INSTRUCTIONS:    Patient is to follow-up with his primary care physician as outpatient  If you experience worsening of your admission symptoms, develop shortness of breath, life threatening emergency, suicidal or homicidal thoughts you must seek medical attention immediately by calling 911 or calling your MD immediately  if symptoms less severe.  You Must read complete instructions/literature along with all the possible adverse reactions/side effects for all the Medicines you take and that have been prescribed to you. Take any new Medicines after you have completely  understood and accept all the possible adverse reactions/side effects.   Please note  You were cared for by a hospitalist during your hospital stay. If you have any questions about your discharge medications or the care you received while you were in the hospital after you are discharged, you can call the unit and asked to speak with the hospitalist on call if the hospitalist that took care of you is not available. Once you are discharged, your primary care physician will handle any further medical issues. Please note that NO REFILLS for any discharge medications will be authorized once you are discharged, as it is imperative that you return to your primary care physician (or establish a relationship with a primary care physician if you do not have one) for your aftercare needs so that they can reassess your need for medications and monitor your lab values.    Today   CHIEF COMPLAINT:   Chief Complaint  Patient presents with  . Abdominal Pain  . Rectal Bleeding    HISTORY OF PRESENT ILLNESS:  Gregory Oconnor  is a 82 y.o. male with a known history of dementia with agitation, essential hypertension, gastrointestinal bleed, who presents to the hospital with complaints of bleeding from rectum. On arrival to the hospital patient was noted  to be agitated and hypertensive with systolic blood pressure around 170. His hemoglobin level, however, was noted to be 13.7, drifting down to 12.7 by the day of discharge with her rehydration. Patient was initiated on IV fluids and gastroenterologist consultation was obtained and gastroenterologist felt that patient's symptoms need to be followed, but no intervention was recommended. Because of patient's advanced dementia and poor cooperation. Since patient's hemoglobin did not drop down significantly,it was felt that patient is stable to be discharged home on iron supplementation at home. Since patient was intermittently agitated and combative in the hospital,  consultation with psychiatrist was obtained, who recommended to advance Seroquel giving patient to take 25 mg of Seroquel 3 times daily as needed with bedtime dose of 25 mg. When attempt was made to give 25 mg of Seroquel at daytime, patient became very somnolent, however, after long discussions patient's family decided to take him home with home health. It is advised to decrease his Seroquel dose at daytime to 12.5 mg 3 times daily as needed, keeping 25 mg at bedtime Discussion by problem 1. Rectal bleed, likley hemorrhoidal vs diverticular, resolving, although patient still has intermittent small amount of bleeding with stool, HGb is stable, now on colace and senna, gastroenterologist saw patient in consultation, but did not recommend any intervention , but medical therapy, due to dementia. Initiated iron supplementation 2. Acute posthemorrhagic anemia, no need to transfuse, Fe supplementation upon dc home 3. Dementia with agitation, appreciate psychiatrist input, decrease seroquel dose to 12.5 milligrams 3 times daily as needed, +25 mg at bedtime, continue Lexapro , off Geodon due to prolonged QTc interval. Patient's wife is not interested in placement, patient is being discharged home with home health services 4. Hypokalemia, supplemented intravenously, level normalized, patient had low normal Mg level , supplemented intravenously as well. Following levels as outpatient intermittently 5. renal insufficiency, chronic CKD st 3, improved on IVF, likely some element of volume depletion , now stable     VITAL SIGNS:  Blood pressure 140/69, pulse 80, temperature 98.2 F (36.8 C), temperature source Oral, resp. rate 20, height 5\' 10"  (1.778 m), weight 62.914 kg (138 lb 11.2 oz), SpO2 97 %.  I/O:   Intake/Output Summary (Last 24 hours) at 10/26/15 1706 Last data filed at 10/26/15 1342  Gross per 24 hour  Intake    840 ml  Output      0 ml  Net    840 ml    PHYSICAL EXAMINATION:  GENERAL:  82  y.o.-year-old patient lying in the bed with no acute distress.  EYES: Pupils equal, round, reactive to light and accommodation. No scleral icterus. Extraocular muscles intact.  HEENT: Head atraumatic, normocephalic. Oropharynx and nasopharynx clear.  NECK:  Supple, no jugular venous distention. No thyroid enlargement, no tenderness.  LUNGS: Normal breath sounds bilaterally, no wheezing, rales,rhonchi or crepitation. No use of accessory muscles of respiration.  CARDIOVASCULAR: S1, S2 normal. No murmurs, rubs, or gallops.  ABDOMEN: Soft, non-tender, non-distended. Bowel sounds present. No organomegaly or mass.  EXTREMITIES: No pedal edema, cyanosis, or clubbing.  NEUROLOGIC: Cranial nerves II through XII are intact. Muscle strength 5/5 in all extremities. Sensation intact. Gait not checked.  PSYCHIATRIC: The patient is alert and oriented x 3.  SKIN: No obvious rash, lesion, or ulcer.   DATA REVIEW:   CBC  Recent Labs Lab 10/24/15 0618  10/26/15 1313  WBC 7.2  --   --   HGB 12.5*  < > 12.7*  HCT 36.6*  --   --  PLT 168  --   --   < > = values in this interval not displayed.  Chemistries   Recent Labs Lab 10/23/15 1022  10/24/15 1514  10/26/15 0523  NA 142  < > 143  < > 141  K 3.3*  < > 2.8*  < > 3.5  CL 107  < > 112*  < > 112*  CO2 27  < > 25  < > 20*  GLUCOSE 94  < > 101*  < > 102*  BUN 33*  < > 28*  < > 19  CREATININE 1.58*  < > 1.31*  < > 1.20  CALCIUM 9.2  < > 8.4*  < > 8.5*  MG  --   --  1.9  --   --   AST 21  --   --   --   --   ALT 12*  --   --   --   --   ALKPHOS 60  --   --   --   --   BILITOT 0.8  --   --   --   --   < > = values in this interval not displayed.  Cardiac Enzymes  Recent Labs Lab 10/23/15 1022  TROPONINI <0.03    Microbiology Results  No results found for this or any previous visit.  RADIOLOGY:  No results found.  EKG:   Orders placed or performed in visit on 10/02/14  . EKG 12-Lead      Management plans discussed with the  patient, family and they are in agreement.  CODE STATUS:     Code Status Orders        Start     Ordered   10/23/15 1431  Do not attempt resuscitation (DNR)   Continuous    Question Answer Comment  In the event of cardiac or respiratory ARREST Do not call a "code blue"   In the event of cardiac or respiratory ARREST Do not perform Intubation, CPR, defibrillation or ACLS   In the event of cardiac or respiratory ARREST Use medication by any route, position, wound care, and other measures to relive pain and suffering. May use oxygen, suction and manual treatment of airway obstruction as needed for comfort.      10/23/15 1430    Code Status History    Date Active Date Inactive Code Status Order ID Comments User Context   10/23/2015  2:10 PM 10/23/2015  2:30 PM Full Code [id number]  Katha Hamming, MD ED      TOTAL TIME TAKING CARE OF THIS PATIENT: 40 minutes.    Katharina Caper M.D on 10/26/2015 at 5:06 PM  Between 7am to 6pm - Pager - [phone]  After 6pm go to [email or web address] - password [email or web address]  [facility]  Office  [phone]  CC: Primary care physician; No primary care provider on file.

## 2015-10-26 NOTE — Care Management Note (Signed)
Case Management Note  Patient Details  Name: Gregory Oconnor MRN: [id number] Date of Birth: 07/17/1924  Subjective/Objective:                  Met with patient's wife (not daughter) to discuss discharge planning. She states that "he gets agitated when he has to stay in the hospital" however, the RN said that he was "like this on admission". He was resting in bed with sitter and his wife at bedside. She wants to take him home and would like to use [facility] for home health. She states patient does not rely on any DME at home and ambulates independently. She has a hired caregiver two days per week through [facility] [employer]. His PCP is Dr. Berrett.  Action/Plan: RNCM will continue to follow.   Expected Discharge Date:  10/25/15               Expected Discharge Plan:     In-House Referral:     Discharge planning Services  CM Consult  Post Acute Care Choice:  Home Health Choice offered to:  Spouse  DME Arranged:    DME Agency:     HH Arranged:  PT, RN HH Agency:  [facility]  Status of Service:  In process, will continue to follow  Medicare Important Message Given:    Date Medicare IM Given:    Medicare IM give by:    Date Additional Medicare IM Given:    Additional Medicare Important Message give by:     If discussed at Long Length of Stay Meetings, dates discussed:    Additional Comments:   , RN 10/26/2015, 10:54 AM  

## 2015-10-26 NOTE — Progress Notes (Signed)
[facility] [place] [facility]   PATIENT NAME: Gregory Oconnor    MR#:  [id number]  DATE OF BIRTH:  Apr 25, 1924  SUBJECTIVE:  CHIEF COMPLAINT:   Chief Complaint  Patient presents with  . Abdominal Pain  . Rectal Bleeding   patient is agitated today, even though Geodon dose was given in the morning. Pushing examiner away. EKG revealed a QTc interval of 540 ms. Psychiatrist consultation was obtained for agitation management. Geodon is discontinued and Seroquel dose was advanced to 3 times dailyas needed,  aded Lexapro.   Review of Systems  Unable to perform ROS: dementia    VITAL SIGNS: Blood pressure 140/69, pulse 80, temperature 98.2 F (36.8 C), temperature source Oral, resp. rate 20, height 5\' 10"  (1.778 m), weight 62.914 kg (138 lb 11.2 oz), SpO2 97 %.  PHYSICAL EXAMINATION:   GENERAL:  82 y.o.-year-old patient lying in the bed in mild distress, restless and trying to get out from bed EYES: Pupils equal, round, reactive to light and accommodation. No scleral icterus. Extraocular muscles intact.  HEENT: Head atraumatic, normocephalic. Oropharynx and nasopharynx clear.  NECK:  Supple, no jugular venous distention. No thyroid enlargement, no tenderness.  LUNGS: Normal breath sounds bilaterally, no wheezing, rales,rhonchi or crepitation. No use of accessory muscles of respiration.  CARDIOVASCULAR: S1, S2 normal. No murmurs, rubs, or gallops.  ABDOMEN: Soft, nontender, nondistended. Bowel sounds present. No organomegaly or mass.  EXTREMITIES: No pedal edema, cyanosis, or clubbing.  NEUROLOGIC: Cranial nerves II through XII are intact. Muscle strength 5/5 in all extremities. Sensation intact. Gait not checked.  PSYCHIATRIC: The patient is alert , confused, agitated. Nonverbal SKIN: No obvious rash, lesion, or ulcer.   ORDERS/RESULTS REVIEWED:   CBC  Recent Labs Lab 10/23/15 1022 10/23/15 1208 10/24/15 0618 10/24/15 1514 10/25/15 0906  10/26/15 1313  WBC 8.3  --  7.2  --   --   --   HGB 14.5 13.7 12.5* 12.0* 12.8* 12.7*  HCT 43.2 41.4 36.6*  --   --   --   PLT 194  --  168  --   --   --   MCV 95.0  --  94.2  --   --   --   MCH 31.8  --  32.1  --   --   --   MCHC 33.5  --  34.1  --   --   --   RDW 14.8*  --  14.8*  --   --   --   LYMPHSABS 1.6  --   --   --   --   --   MONOABS 0.7  --   --   --   --   --   EOSABS 0.1  --   --   --   --   --   BASOSABS 0.1  --   --   --   --   --    ------------------------------------------------------------------------------------------------------------------  Chemistries   Recent Labs Lab 10/23/15 1022 10/24/15 0618 10/24/15 1514 10/24/15 2254 10/25/15 0906 10/26/15 0523  NA 142 145 143  --  142 141  K 3.3* 3.0* 2.8* 3.3* 2.7* 3.5  CL 107 112* 112*  --  110 112*  CO2 27 23 25   --  23 20*  GLUCOSE 94 97 101*  --  106* 102*  BUN 33* 31* 28*  --  21* 19  CREATININE 1.58* 1.35* 1.31*  --  1.30* 1.20  CALCIUM 9.2 8.7* 8.4*  --  8.7* 8.5*  MG  --   --  1.9  --   --   --   AST 21  --   --   --   --   --   ALT 12*  --   --   --   --   --   ALKPHOS 60  --   --   --   --   --   BILITOT 0.8  --   --   --   --   --    ------------------------------------------------------------------------------------------------------------------ estimated creatinine clearance is 37.1 mL/min (by C-G formula based on Cr of 1.2). ------------------------------------------------------------------------------------------------------------------ No results for input(s): TSH, T4TOTAL, T3FREE, THYROIDAB in the last 72 hours.  Invalid input(s): FREET3  Cardiac Enzymes  Recent Labs Lab 10/23/15 1022  TROPONINI <0.03   ------------------------------------------------------------------------------------------------------------------ Invalid input(s): POCBNP ---------------------------------------------------------------------------------------------------------------  RADIOLOGY: No results  found.  EKG:  Orders placed or performed in visit on 10/02/14  . 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 Rectal bleed, likley hemorrhoidal vs diverticular, resolving, although patient still has intermittent small amount of bleeding with stool, HGb is stable, now on colace and senna, gastroenterologist saw patient in consultation, but did not recommend any intervention , but medical therapy, due to dementia. Initiated iron supplementation 2. Acute posthemorrhagic anemia, no need to transfuse, Fe supplementation upon dc home 3. Dementia with agitation, appreciate psychiatrist input, decrease seroquel dose to 12.53 times daily as needed, +25 mg at bedtime, discontinue lorazepam, continue Lexapro , off Geodon due to prolonged QTc interval. Patient's wife is not interested in placement, we will be discharging home with home health services 4. Hypokalemia, supplemented intravenously, level normalized, patient had low normal Mg level , supplemented intravenously as well. Following levels as outpatient intermittently 5. renal insufficiency, chronic CKD st 3, improved on IVF, now stable, likely mild volume depletion related   Management plans discussed with the patient, family and they are in agreement.   DRUG ALLERGIES:  Allergies  Allergen Reactions  . Macrolides And Ketolides Hives and Other (See Comments)    agitation    CODE STATUS:     Code Status Orders        Start     Ordered   10/23/15 1431  Do not attempt resuscitation (DNR)   Continuous    Question Answer Comment  In the event of cardiac or respiratory ARREST Do not call a "code blue"   In the event of cardiac or respiratory ARREST Do not  perform Intubation, CPR, defibrillation or ACLS   In the event of cardiac or respiratory ARREST Use medication by any route, position, wound care, and other measures to relive pain and suffering. May use oxygen, suction and manual treatment of airway obstruction as needed for comfort.      10/23/15 1430    Code Status History    Date Active Date Inactive Code Status Order ID Comments User Context   10/23/2015  2:10 PM 10/23/2015  2:30 PM Full Code [id number]  Katha Hamming, MD ED      TOTAL TIME TAKING CARE OF THIS PATIENT: 40 minutes.  Discussed with patient's wife  Gregory Oconnor M.D on 10/26/2015 at 3:48 PM  Between 7am  to 6pm - Pager - [phone]  After 6pm go to [email or web address] - password [email or web address] [facility]  [facility]  Office  [phone]  CC: Primary care physician; No primary care provider on file.

## 2015-10-26 NOTE — Care Management (Addendum)
Wife called RNCM asking if [facility] could provide services and I am checking with Clydie BraunKaren but I explained that part of the criteria for [facility] is that patient meets palliative services or demise expected within 12 months. Barbara CowerJason with [facility] notified of discharge to home today. Wife said EMS was not needed. Case closed.  Update: [facility] can take this patient. Barbara CowerJason with [facility] notified of this change. Case closed.

## 2015-10-26 NOTE — Progress Notes (Signed)
[facility] referral received from [facility]. Patient is an 82 year old man with a history of dementia and HTN admitted on 1/13 for treatment of GI bleed. Services of skilled nursing, PT and home health aide have been requested. Patient has required medication adjustments, one to one sitter and psychiatry consult due to agitation.  Writer met in the patient's ro Patient remained asleep through out visit.  Patient information, F2F and home health orders faxed to referral intake. Plan is for discharge today, Mrs. Stipes stated her son was going to help her take him home by car. Thank you for the opportunity to be involved in the care of this patient. Flo Shanks RN, BSN, [facility], hospital liaison [phone] c

## 2015-10-26 NOTE — Progress Notes (Addendum)
Patient was agitated and attempted to get out of bed in presence of wife and sitter in room this morning.  Gave Seroquel and since patient has been very lethargic and sedated.  PT attempted to work with patient and he was a max assist.  Patients wife stated that prior to admission he was walking around home without any assistive devices.   Patient wife wishes to take him home but after he has awoken some following the seroquel.  She believes he will do much better when he is at home.  She has several nursing family members and a son who are able to help.  Explained to her that discharge can proceed if she feels comfortable otherwise discharge could be held until tomorrow. Patient would benefit for snf placement but wife does not wish to proceed with snf.  Sitter overheard that she would not be able to afford their living arrangement if he was placed in a facility because his check would go to the facility.   Plan to discharge to home.  Explained the benefits of patient being transported via ems.  As patient is unable to walk at present time.

## 2015-10-26 NOTE — Progress Notes (Signed)
Pt very combative and agitated. Pulling at medical equipment. Administered .5mg  of ativan IM as ordered to right deltoid. Wife and sitter at bedside. Will cont to monitor

## 2015-10-26 NOTE — Progress Notes (Deleted)
Patient cooperative with care, occasional coughing episodes without production. Jevity feed increased from 45 to 55. Pain managed with po medication.  

## 2015-10-26 NOTE — Care Management (Addendum)
I have paged MD to see if PT is appropriate at this time. Severe dementia with agitation- psych has adjusted Rx. I will contact patient's wife at [phone] to discuss discharge planning as LTC will be private pay. Spoke with Dr. Winona LegatoVaickute and daughter would like home health. RNCM will continue to follow.

## 2015-10-26 NOTE — Progress Notes (Signed)
Called wife to advise that EMS arrived and patient will be transported to home.

## 2015-10-26 NOTE — Consult Note (Signed)
GI Inpatient Follow-up Note  Patient Identification: Gregory Oconnor is a 82 y.o. male with severe dementia, history of diverticular bleeds, admitted for rectal bleeding  Subjective: Safety sitter reports that she changed a diaper today and no blood was seen.  Wife is present and reports that he did have a stool last night with a small amount of blood and there was BRB present when wiped.  No new hgb lab drawn today.  Spent time with wife explaining diverticular bleeds and what to look for.  Scheduled Inpatient Medications:  . amLODipine  5 mg Oral Daily  . docusate  100 mg Oral BID  . donepezil  10 mg Oral QHS  . escitalopram  5 mg Oral Daily  . ferrous sulfate  220 mg Oral Q breakfast  . isosorbide mononitrate  120 mg Oral Daily  . vitamin B-12  1,000 mcg Oral Daily    Continuous Inpatient Infusions:   . dextrose 5 % and 0.45 % NaCl with KCl 20 mEq/L 50 mL/hr at 10/26/15 1155    PRN Inpatient Medications:  acetaminophen **OR** acetaminophen, alum & mag hydroxide-simeth, hydrALAZINE, LORazepam **OR** LORazepam, nitroGLYCERIN, ondansetron **OR** ondansetron (ZOFRAN) IV, QUEtiapine, senna  Review of Systems: Unable to assess due to condition   Physical Examination: BP 139/87 mmHg  Pulse 108  Temp(Src) 98.1 F (36.7 C) (Oral)  Resp 20  Ht 5\' 10"  (1.778 m)  Wt 62.914 kg (138 lb 11.2 oz)  BMI 19.90 kg/m2  SpO2 100% Gen: calm, medicated Did not complete PE due to combativeness  Data: Lab Results  Component Value Date   WBC 7.2 10/24/2015   HGB 12.8* 10/25/2015   HCT 36.6* 10/24/2015   MCV 94.2 10/24/2015   PLT 168 10/24/2015    Recent Labs Lab 10/24/15 0618 10/24/15 1514 10/25/15 0906  HGB 12.5* 12.0* 12.8*   Lab Results  Component Value Date   NA 141 10/26/2015   K 3.5 10/26/2015   CL 112* 10/26/2015   CO2 20* 10/26/2015   BUN 19 10/26/2015   CREATININE 1.20 10/26/2015   Lab Results  Component Value Date   ALT 12* 10/23/2015   AST 21  10/23/2015   ALKPHOS 60 10/23/2015   BILITOT 0.8 10/23/2015    Recent Labs Lab 10/23/15 1022  APTT 34  INR 1.04   Assessment/Plan: Mr. Gregory Oconnor is a 82 y.o. male with severe dementia with combativeness, history of diverticular bleeding. small amount of blood in stool last night, no blood noted in diaper today. Hgb improving from 12.5 to 12.8. (no new hgb today)   Recommendations: Likely being discharged today.  No new GI recommendations at this time. Please call with questions or concerns.  Carney Harderari S Richards, PA-C  I personally performed these services.

## 2015-10-26 NOTE — Care Management (Signed)
Notified by PT that patient was not able to stand and was lethargic. No way he can ride in a car. I have notified Dr. Winona LegatoVaickute and she states that patient's status was previously discussed with patient's wife. She states she discussed Rx for agitation being only for PRN. EMS packet created.

## 2015-10-26 NOTE — Progress Notes (Signed)
Per RN patient is discharging home and will need EMS for transport. RN confirmed patient's address. Clinical Child psychotherapistocial Worker (CSW) prepared EMS packet including DNR. Please reconsult if future social work needs arise. CSW signing off.   Jetta LoutBailey Morgan, LCSW [phone]

## 2015-12-22 ENCOUNTER — Encounter: Payer: Self-pay | Admitting: *Deleted

## 2015-12-22 ENCOUNTER — Emergency Department
Admission: EM | Admit: 2015-12-22 | Discharge: 2015-12-22 | Disposition: A | Discharge status: Home / Self Care | Payer: Medicare Other | Attending: Emergency Medicine | Admitting: Emergency Medicine

## 2015-12-22 DIAGNOSIS — Z79899 Other long term (current) drug therapy: Secondary | ICD-10-CM | POA: Diagnosis not present

## 2015-12-22 DIAGNOSIS — F0390 Unspecified dementia without behavioral disturbance: Secondary | ICD-10-CM | POA: Insufficient documentation

## 2015-12-22 DIAGNOSIS — S0081XA Abrasion of other part of head, initial encounter: Secondary | ICD-10-CM

## 2015-12-22 DIAGNOSIS — I129 Hypertensive chronic kidney disease with stage 1 through stage 4 chronic kidney disease, or unspecified chronic kidney disease: Secondary | ICD-10-CM | POA: Diagnosis not present

## 2015-12-22 DIAGNOSIS — W1839XA Other fall on same level, initial encounter: Secondary | ICD-10-CM | POA: Diagnosis not present

## 2015-12-22 DIAGNOSIS — Y9389 Activity, other specified: Secondary | ICD-10-CM | POA: Diagnosis not present

## 2015-12-22 DIAGNOSIS — Y9289 Other specified places as the place of occurrence of the external cause: Secondary | ICD-10-CM | POA: Diagnosis not present

## 2015-12-22 DIAGNOSIS — S0990XA Unspecified injury of head, initial encounter: Secondary | ICD-10-CM | POA: Diagnosis present

## 2015-12-22 DIAGNOSIS — N183 Chronic kidney disease, stage 3 (moderate): Secondary | ICD-10-CM | POA: Diagnosis not present

## 2015-12-22 DIAGNOSIS — Y998 Other external cause status: Secondary | ICD-10-CM | POA: Diagnosis not present

## 2015-12-22 DIAGNOSIS — W19XXXA Unspecified fall, initial encounter: Secondary | ICD-10-CM

## 2015-12-22 LAB — GLUCOSE, CAPILLARY: Glucose-Capillary: 81 mg/dL (ref 65–99)

## 2015-12-22 MED ORDER — QUETIAPINE FUMARATE 25 MG PO TABS: 12.5000 mg | ORAL_TABLET | Freq: Every day | ORAL | Status: DC

## 2015-12-22 NOTE — ED Provider Notes (Signed)
[facility] Emergency Department Provider Note  ____________________________________________  Time seen: Approximately 7:20 AM  I have reviewed the triage vital signs and the nursing notes.   HISTORY  Chief Complaint Fall    HPI Gregory Oconnor is a 82 y.o. male history of significant dementia, chronic kidney disease, rectal bleeding but never had colonoscopy on last admission.  EM caveat: The patient's history and physical are limited by severe dementia  Report from assisted living is that the patient was found having fallen today. He had an abrasion over the right forehead.  The patient reports that he feels fine. He is not aware that he had injury to the head. He is unable to provide much history, but states that nothing is hurting. Denies headache, denies neck pain, no numbness tingling or weakness. Denies pain in his chest or trouble breathing.  I called and spoke to the patient's wife, after discussion she noted that they would not wish for patient to have a CAT scan of his head or neck to evaluate for bleeding as the patient would likely does not want any hospitalization or surgery. After further discussing with the wife, we will not perform any labs or CT of the head. I think with shared medical decision making in discussing the patient's goals of care this is reasonable. Rather, we will provide wound care, and should the patient develop severe symptoms like severe confusion, bleeding, falling over or passing out and will bring him back to the emergency room.... But at the present time do not wish for evaluation beyond caring for his forehead wound.  Past Medical History  Diagnosis Date  . GI bleed   . Dementia   . Hypertension     Patient Active Problem List   Diagnosis Date Noted  . Pressure ulcer 10/25/2015  . 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 Hypokalemia 10/24/2015  . Malignant essential  hypertension 10/24/2015  . CKD (chronic kidney disease) stage 3, GFR 30-59 ml/min 10/24/2015  . Acute posthemorrhagic anemia 10/24/2015  . Rectal bleed 10/23/2015    History reviewed. No pertinent past surgical history.  Current Outpatient Rx  Name  Route  Sig  Dispense  Refill  . amLODipine (NORVASC) 10 MG tablet   Oral   Take 1 tablet (10 mg total) by mouth daily.   30 tablet   6   . Cyanocobalamin (RA VITAMIN B-12 TR) 1000 MCG TBCR   Oral   Take 1,000 mg by mouth daily.         Marland Kitchen. docusate (COLACE) 50 MG/5ML liquid   Oral   Take 10 mLs (100 mg total) by mouth 2 (two) times daily.   100 mL   0   . donepezil (ARICEPT) 10 MG tablet   Oral   Take 10 mg by mouth at bedtime.         Marland Kitchen. escitalopram (LEXAPRO) 5 MG tablet   Oral   Take 1 tablet (5 mg total) by mouth daily.   30 tablet   6   . ferrous sulfate 220 (44 Fe) MG/5ML solution   Oral   Take 5 mLs (220 mg total) by mouth daily with breakfast.   150 mL   3   . isosorbide mononitrate (IMDUR) 120 MG 24 hr tablet   Oral   Take 120 mg by mouth daily.         . nitroGLYCERIN (NITROSTAT) 0.4 MG SL tablet   Sublingual  Place 0.4 mg under the tongue every 5 (five) minutes as needed for chest pain.          Marland Kitchen QUEtiapine (SEROQUEL) 25 MG tablet   Oral   Take 1 tablet (25 mg total) by mouth at bedtime.   30 tablet   6   . QUEtiapine (SEROQUEL) 25 MG tablet   Oral   Take 0.5 tablets (12.5 mg total) by mouth 3 (three) times daily as needed (agitation).   90 tablet   6   . senna (SENOKOT) 8.6 MG TABS tablet   Oral   Take 1 tablet (8.6 mg total) by mouth daily as needed for mild constipation.   120 each   0     Allergies Macrolides and ketolides  History reviewed. No pertinent family history.  Social History Social History  Substance Use Topics  . Smoking status: Never Smoker   . Smokeless tobacco: None  . Alcohol Use: No    Review of Systems EM caveat: Per patient's wife he has been  acting normal which is agitated, not wanting to be touched, and at his baseline dementia recently. No recent illness. He was hospitalized in January, but did not have a colonoscopy because of agitation and goals of care.  The patient's wife arrives to the bedside and reports that he is acting normally. She is agreeable with plan for wound care and not treating him with any aggressive studies.  ____________________________________________   PHYSICAL EXAM:  VITAL SIGNS: ED Triage Vitals  Enc Vitals Group     BP 12/22/15 0711 182/90 mmHg     Pulse Rate 12/22/15 0711 66     Resp 12/22/15 0711 18     Temp 12/22/15 0711 97.9 F (36.6 C)     Temp Source 12/22/15 0711 Oral     SpO2 12/22/15 0711 97 %     Weight 12/22/15 0711 150 lb (68.04 kg)     Height 12/22/15 0711  (1.778 m)     Head Cir --      Peak Flow --      Pain Score --      Pain Loc --      Pain Edu? --      Excl. in GC? --    Constitutional: Alert and disoriented. Calm when resting, but when attempts to examine gets agitated and withdraws. "Don't touch me" Eyes: Conjunctivae are normal. PERRL. EOMI. Head: Atraumatic except for an approximately half-dollar size abrasion overlying the right frontal lobe without major hematoma, or active bleeding. Nose: No congestion/rhinnorhea. Mouth/Throat: Mucous membranes are moist.  Oropharynx non-erythematous. Neck: No stridor.  No cervical tenderness or deformity noted. Cardiovascular: Normal rate, regular rhythm. Grossly normal heart sounds.  Good peripheral circulation. Respiratory: Normal respiratory effort.  No retractions. Lungs CTAB. Gastrointestinal: Soft and nontender. No distention.   Musculoskeletal: The patient somewhat resists examination and does not follow commands thoroughly, however he will lift up his arms when asked and also lifts of both legs and states neither hurt. There is no evidence of deformity, long bone tenderness noted by examination. No lower extremity  tenderness though there is 1+ lower extremity edema bilateral.  No joint effusions. Neurologic:  No gross focal deficits. Moves all extremities well. Symmetric smile. Skin:  Skin is warm, dry and intact except abrasion and forehead as noted. No rash noted. Psychiatric: Calm when not examined, but gets agitated when asked questions are asked to perform exam. Patient frequently refuses her states he does not wish  to be touched. Speech is clear. ____________________________________________   LABS (all labs ordered are listed, but only abnormal results are displayed)  Labs Reviewed  GLUCOSE, CAPILLARY  CBG MONITORING, ED   ____________________________________________  EKG  Patient would not sit still for EKG. Discussed with the wife, in agreement patient does not have any chest pain clear cardiopulmonary symptoms. ____________________________________________  RADIOLOGY   ____________________________________________   PROCEDURES  Procedure(s) performed: None  Critical Care performed: No  ____________________________________________   INITIAL IMPRESSION / ASSESSMENT AND PLAN / ED COURSE  Pertinent labs & imaging results that were available during my care of the patient were reviewed by me and considered in my medical decision making (see chart for details).  Patient presents after a unwitnessed fall. He seems to be at his mental baseline which includes being alert but resistant to exam, agitated and somewhat described as "grumpy". He has dementia with behavioral disturbance. Discussed with the patient's wife, and we will not treat him with any aggressive care at this time and we will not perform CT of the head or neck given patient's goals of care. I think is very reasonable, I do not believe the patient is at high risk for intracranial hemorrhage as he would've had a ground-level type fall, and he would not have been taking any anticoagulants. He is at his mental baseline. He has no  clear evidence of injury to the extremities. I will attempt to obtain an EKG and check a point-of-care blood sugar, however we will see how patient does with this.   No history or findings to support active bleeding.  Return precautions and treatment recommendations and follow-up discussed with the patient's wife  who is agreeable with the plan.  ____________________________________________   FINAL CLINICAL IMPRESSION(S) / ED DIAGNOSES  Final diagnoses:  Fall, initial encounter  Abrasion of face, initial encounter      Sharyn Creamer, MD 12/22/15 [id number]

## 2015-12-22 NOTE — Discharge Instructions (Signed)
You were seen in the Emergency Department (ED) today for a head injury.  We discussed obtaining a CT of the head and neck, but given patient's goals of care and thought that he would not want to be hospitalized or have surgery even if there was a potential serious injury or bleeding about the brain, we will forego obtaining a CT scan now.  Return to the emergency department or follow-up with your primary care doctor if your symptoms are not improving over this time.  Signs of a more serious head injury include vomiting, severe headache, excessive sleepiness or confusion, and weakness or numbness in your face, arms or legs.  Return immediately to the Emergency Department if you experience any of these more concerning symptoms.    Abrasion An abrasion is a cut or scrape on the outer surface of your skin. An abrasion does not extend through all of the layers of your skin. It is important to care for your abrasion properly to prevent infection. CAUSES Most abrasions are caused by falling on or gliding across the ground or another surface. When your skin rubs on something, the outer and inner layer of skin rubs off.  SYMPTOMS A cut or scrape is the main symptom of this condition. The scrape may be bleeding, or it may appear red or pink. If there was an associated fall, there may be an underlying bruise. DIAGNOSIS An abrasion is diagnosed with a physical exam. TREATMENT Treatment for this condition depends on how large and deep the abrasion is. Usually, your abrasion will be cleaned with water and mild soap. This removes any dirt or debris that may be stuck. An antibiotic ointment may be applied to the abrasion to help prevent infection. A bandage (dressing) may be placed on the abrasion to keep it clean. You may also need a tetanus shot. HOME CARE INSTRUCTIONS Medicines  Take or apply medicines only as directed by your health care provider.  If you were prescribed an antibiotic ointment, finish all  of it even if you start to feel better. Wound Care  Clean the wound with mild soap and water 2-3 times per day or as directed by your health care provider. Pat your wound dry with a clean towel. Do not rub it.  There are many different ways to close and cover a wound. Follow instructions from your health care provider about:  Wound care.  Dressing changes and removal.  Check your wound every day for signs of infection. Watch for:  Redness, swelling, or pain.  Fluid, blood, or pus. General Instructions  Keep the dressing dry as directed by your health care provider. Do not take baths, swim, use a hot tub, or do anything that would put your wound underwater until your health care provider approves.  If there is swelling, raise (elevate) the injured area above the level of your heart while you are sitting or lying down.  Keep all follow-up visits as directed by your health care provider. This is important. SEEK MEDICAL CARE IF:  You received a tetanus shot and you have swelling, severe pain, redness, or bleeding at the injection site.  Your pain is not controlled with medicine.  You have increased redness, swelling, or pain at the site of your wound. SEEK IMMEDIATE MEDICAL CARE IF:  You have a red streak going away from your wound.  You have a fever.  You have fluid, blood, or pus coming from your wound.  You notice a bad smell coming from your  wound or your dressing.   This information is not intended to replace advice given to you by your health care provider. Make sure you discuss any questions you have with your health care provider.   Document Released: 07/06/2005 Document Revised: 06/17/2015 Document Reviewed: 09/24/2014 Elsevier Interactive Patient Education [employer]

## 2015-12-22 NOTE — ED Notes (Signed)
pts head abrasion cleaned and wrapped in gauze, pt refused EKG, pulling off leads, MD aware

## 2015-12-22 NOTE — ED Notes (Addendum)
Pt arrives via EMS from [facility], pt had unwitnessed fall this AM, hx of dementia, arrival with abrasion to right forehead, MD at bedside, EMs reports pt was combative in ambulance and unable to preform vital signs

## 2016-02-02 ENCOUNTER — Emergency Department: Payer: Medicare Other

## 2016-02-02 ENCOUNTER — Emergency Department
Admission: EM | Admit: 2016-02-02 | Discharge: 2016-02-02 | Disposition: A | Discharge status: Skilled Nursing Facility | Payer: Medicare Other | Attending: Emergency Medicine | Admitting: Emergency Medicine

## 2016-02-02 ENCOUNTER — Encounter: Payer: Self-pay | Admitting: Emergency Medicine

## 2016-02-02 DIAGNOSIS — N183 Chronic kidney disease, stage 3 (moderate): Secondary | ICD-10-CM | POA: Insufficient documentation

## 2016-02-02 DIAGNOSIS — Y999 Unspecified external cause status: Secondary | ICD-10-CM | POA: Diagnosis not present

## 2016-02-02 DIAGNOSIS — S0001XA Abrasion of scalp, initial encounter: Secondary | ICD-10-CM | POA: Diagnosis not present

## 2016-02-02 DIAGNOSIS — G309 Alzheimer's disease, unspecified: Secondary | ICD-10-CM | POA: Diagnosis not present

## 2016-02-02 DIAGNOSIS — X58XXXA Exposure to other specified factors, initial encounter: Secondary | ICD-10-CM | POA: Insufficient documentation

## 2016-02-02 DIAGNOSIS — I129 Hypertensive chronic kidney disease with stage 1 through stage 4 chronic kidney disease, or unspecified chronic kidney disease: Secondary | ICD-10-CM | POA: Diagnosis not present

## 2016-02-02 DIAGNOSIS — Y939 Activity, unspecified: Secondary | ICD-10-CM | POA: Insufficient documentation

## 2016-02-02 DIAGNOSIS — E876 Hypokalemia: Secondary | ICD-10-CM | POA: Insufficient documentation

## 2016-02-02 DIAGNOSIS — Y929 Unspecified place or not applicable: Secondary | ICD-10-CM | POA: Insufficient documentation

## 2016-02-02 DIAGNOSIS — S0990XA Unspecified injury of head, initial encounter: Secondary | ICD-10-CM

## 2016-02-02 DIAGNOSIS — Z23 Encounter for immunization: Secondary | ICD-10-CM | POA: Diagnosis not present

## 2016-02-02 DIAGNOSIS — W19XXXA Unspecified fall, initial encounter: Secondary | ICD-10-CM

## 2016-02-02 MED ORDER — TETANUS-DIPHTH-ACELL PERTUSSIS 5-2.5-18.5 LF-MCG/0.5 IM SUSP
0.5000 mL | Freq: Once | INTRAMUSCULAR | Status: DC
Start: 2016-02-02 — End: 2016-02-02

## 2016-02-02 MED ORDER — TETANUS-DIPHTH-ACELL PERTUSSIS 5-2.5-18.5 LF-MCG/0.5 IM SUSP
0.5000 mL | Freq: Once | INTRAMUSCULAR | Status: AC
  Administered 2016-02-02: 0.5 mL via INTRAMUSCULAR
  Filled 2016-02-02: qty 0.5

## 2016-02-02 NOTE — ED Notes (Addendum)
Pt arrived via EMS from [facility]. Staff called EMS after finding pt with a laceration to right side of forehead and blood on his clothing. Staff is guessing that he fell, but he was not found on the ground. Pt is not complaining of anything at present. Has dementia, alert to self

## 2016-02-02 NOTE — ED Provider Notes (Signed)
[facility] Emergency Department Provider Note  ____________________________________________    I have reviewed the triage vital signs and the nursing notes.   HISTORY  Chief Complaint Fall and Head Laceration  Alzheimer's dementia limits history  HPI Gregory Oconnor is a 82 y.o. male who presents with a head injury. It is not clear how this happened. Patient presents from [facility], the patient has extensive dementia and is unable to provide history.     Past Medical History  Diagnosis Date  . GI bleed   . Dementia   . Hypertension     Patient Active Problem List   Diagnosis Date Noted  . Pressure ulcer 10/25/2015  . 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 Hypokalemia 10/24/2015  . Malignant essential hypertension 10/24/2015  . CKD (chronic kidney disease) stage 3, GFR 30-59 ml/min 10/24/2015  . Acute posthemorrhagic anemia 10/24/2015  . Rectal bleed 10/23/2015    History reviewed. No pertinent past surgical history.  Current Outpatient Rx  Name  Route  Sig  Dispense  Refill  . amLODipine (NORVASC) 10 MG tablet   Oral   Take 1 tablet (10 mg total) by mouth daily.   30 tablet   6   . Cyanocobalamin (RA VITAMIN B-12 TR) 1000 MCG TBCR   Oral   Take 1,000 mg by mouth daily.         Marland Kitchen. docusate (COLACE) 50 MG/5ML liquid   Oral   Take 10 mLs (100 mg total) by mouth 2 (two) times daily.   100 mL   0   . donepezil (ARICEPT) 10 MG tablet   Oral   Take 10 mg by mouth at bedtime.         Marland Kitchen. escitalopram (LEXAPRO) 5 MG tablet   Oral   Take 1 tablet (5 mg total) by mouth daily.   30 tablet   6   . ferrous sulfate 220 (44 Fe) MG/5ML solution   Oral   Take 5 mLs (220 mg total) by mouth daily with breakfast.   150 mL   3   . isosorbide mononitrate (IMDUR) 120 MG 24 hr tablet   Oral   Take 120 mg by mouth daily.         . nitroGLYCERIN (NITROSTAT) 0.4 MG SL tablet  Sublingual   Place 0.4 mg under the tongue every 5 (five) minutes as needed for chest pain.          Marland Kitchen. QUEtiapine (SEROQUEL) 25 MG tablet   Oral   Take 1 tablet (25 mg total) by mouth at bedtime.   30 tablet   6   . QUEtiapine (SEROQUEL) 25 MG tablet   Oral   Take 0.5 tablets (12.5 mg total) by mouth 3 (three) times daily as needed (agitation).   90 tablet   6   . senna (SENOKOT) 8.6 MG TABS tablet   Oral   Take 1 tablet (8.6 mg total) by mouth daily as needed for mild constipation.   120 each   0     Allergies Azithromycin; Cephalosporins; Lisinopril; and Macrolides and ketolides  History reviewed. No pertinent family history.  Social History Social History  Substance Use Topics  . Smoking status: Never Smoker   . Smokeless tobacco: None  . Alcohol Use: No    Review of Systems Limited by dementia     Cardiovascular: Patient denies chest pain Respiratory: Patient denies shortness of breath Gastrointestinal: He denies  abdominal pain  Musculoskeletal: He denies back pain and extremity pain  Neurological: Moves all extremities     ____________________________________________   PHYSICAL EXAM:  VITAL SIGNS: ED Triage Vitals  Enc Vitals Group     BP 02/02/16 0639 175/112 mmHg     Pulse Rate 02/02/16 0639 92     Resp 02/02/16 0639 16     Temp 02/02/16 0639 97.5 F (36.4 C)     Temp Source 02/02/16 0639 Oral     SpO2 02/02/16 0639 96 %     Weight 02/02/16 0639 149 lb 9.6 oz (67.858 kg)     Height 02/02/16 0639  (1.727 m)     Head Cir --      Peak Flow --      Pain Score 02/02/16 0640 0     Pain Loc --      Pain Edu? --      Excl. in GC? --      Constitutional: Alert. No acute distress Eyes: Conjunctivae are normal. No erythema or injection ENT   Head: Normocephalic. Approximate 1x1 cm abrasion to the right frontal area. Bleeding controlled, no other injuries noted   Mouth/Throat: Mucous membranes are moist. Cardiovascular: Normal  rate,regular rhythm. Normal and symmetric distal pulses are present in the upper extremities.  Respiratory: Normal respiratory effort without tachypnea nor retractions. Breath sounds are clear and equal bilaterally.  Gastrointestinal: Soft and non-tender in all quadrants. No distention.  Genitourinary: deferred Musculoskeletal: Nontender with nonpainful range of motion in all extremities. Lower extremity edema noted bilaterally. No hip pain with axial load or range of motion. No pelvic tenderness palpation. No vertebral tenderness to palpation. Neurologic:  No gross focal neurologic deficits are appreciated. Skin:  Skin is warm, dry. Abrasion on head as above  ____________________________________________    LABS (pertinent positives/negatives)  Labs Reviewed - No data to display  ____________________________________________   EKG  ED ECG REPORT I, Jene Every, the attending physician, personally viewed and interpreted this ECG.   Date: 02/02/2016  EKG Time: 6:40 AM  Rate: 81  Rhythm: Paced rhythm  Axis: Right axis  Intervals:nonspecific intraventricular conduction delay  ST&T Change: Nonspecific   ____________________________________________    RADIOLOGY  CT head and C-spine unremarkable  ____________________________________________   PROCEDURES  Procedure(s) performed: none  Critical Care performed: none  ____________________________________________   INITIAL IMPRESSION / ASSESSMENT AND PLAN / ED COURSE  Pertinent labs & imaging results that were available during my care of the patient were reviewed by me and considered in my medical decision making (see chart for details).  Patient with abrasion to the right frontal area, no repair necessary. No other injuries noted on careful physical exam. CT head and C-spine are unremarkable. We will clean and dress the wound.   Tetanus given.  ____________________________________________   FINAL CLINICAL  IMPRESSION(S) / ED DIAGNOSES  Final diagnoses:  Head injury, initial encounter  Abrasion of scalp, initial encounter          Jene Every, MD 02/02/16 1307

## 2016-02-02 NOTE — Discharge Instructions (Signed)
Abrasion °An abrasion is a cut or scrape on the outer surface of your skin. An abrasion does not extend through all of the layers of your skin. It is important to care for your abrasion properly to prevent infection. °CAUSES °Most abrasions are caused by falling on or gliding across the ground or another surface. When your skin rubs on something, the outer and inner layer of skin rubs off.  °SYMPTOMS °A cut or scrape is the main symptom of this condition. The scrape may be bleeding, or it may appear red or pink. If there was an associated fall, there may be an underlying bruise. °DIAGNOSIS °An abrasion is diagnosed with a physical exam. °TREATMENT °Treatment for this condition depends on how large and deep the abrasion is. Usually, your abrasion will be cleaned with water and mild soap. This removes any dirt or debris that may be stuck. An antibiotic ointment may be applied to the abrasion to help prevent infection. A bandage (dressing) may be placed on the abrasion to keep it clean. °You may also need a tetanus shot. °HOME CARE INSTRUCTIONS °Medicines °· Take or apply medicines only as directed by your health care provider. °· If you were prescribed an antibiotic ointment, finish all of it even if you start to feel better. °Wound Care °· Clean the wound with mild soap and water 2-3 times per day or as directed by your health care provider. Pat your wound dry with a clean towel. Do not rub it. °· There are many different ways to close and cover a wound. Follow instructions from your health care provider about: °· Wound care. °· Dressing changes and removal. °· Check your wound every day for signs of infection. Watch for: °· Redness, swelling, or pain. °· Fluid, blood, or pus. °General Instructions °· Keep the dressing dry as directed by your health care provider. Do not take baths, swim, use a hot tub, or do anything that would put your wound underwater until your health care provider approves. °· If there is  swelling, raise (elevate) the injured area above the level of your heart while you are sitting or lying down. °· Keep all follow-up visits as directed by your health care provider. This is important. °SEEK MEDICAL CARE IF: °· You received a tetanus shot and you have swelling, severe pain, redness, or bleeding at the injection site. °· Your pain is not controlled with medicine. °· You have increased redness, swelling, or pain at the site of your wound. °SEEK IMMEDIATE MEDICAL CARE IF: °· You have a red streak going away from your wound. °· You have a fever. °· You have fluid, blood, or pus coming from your wound. °· You notice a bad smell coming from your wound or your dressing. °  °This information is not intended to replace advice given to you by your health care provider. Make sure you discuss any questions you have with your health care provider. °  °Document Released: 07/06/2005 Document Revised: 06/17/2015 Document Reviewed: 09/24/2014 °Elsevier Interactive Patient Education ©2016 [employer] ° °Head Injury, Adult °You have a head injury. Headaches and throwing up (vomiting) are common after a head injury. It should be easy to wake up from sleeping. Sometimes you must stay in the hospital. Most problems happen within the first 24 hours. Side effects may occur up to 7-10 days after the injury.  °WHAT ARE THE TYPES OF HEAD INJURIES? °Head injuries can be as minor as a bump. Some head injuries can be more   severe. More severe head injuries include: °· A jarring injury to the brain (concussion). °· A bruise of the brain (contusion). This mean there is bleeding in the brain that can cause swelling. °· A cracked skull (skull fracture). °· Bleeding in the brain that collects, clots, and forms a bump (hematoma). °WHEN SHOULD I GET HELP RIGHT AWAY?  °· You are confused or sleepy. °· You cannot be woken up. °· You feel sick to your stomach (nauseous) or keep throwing up (vomiting). °· Your dizziness or unsteadiness is  getting worse. °· You have very bad, lasting headaches that are not helped by medicine. Take medicines only as told by your doctor. °· You cannot use your arms or legs like normal. °· You cannot walk. °· You notice changes in the black spots in the center of the colored part of your eye (pupil). °· You have clear or bloody fluid coming from your nose or ears. °· You have trouble seeing. °During the next 24 hours after the injury, you must stay with someone who can watch you. This person should get help right away (call 911 in the [place] if you start to shake and are not able to control it (have seizures), you pass out, or you are unable to wake up. °HOW CAN I PREVENT A HEAD INJURY IN THE FUTURE? °· Wear seat belts. °· Wear a helmet while bike riding and playing sports like football. °· Stay away from dangerous activities around the house. °WHEN CAN I RETURN TO NORMAL ACTIVITIES AND ATHLETICS? °See your doctor before doing these activities. You should not do normal activities or play contact sports until 1 week after the following symptoms have stopped: °· Headache that does not go away. °· Dizziness. °· Poor attention. °· Confusion. °· Memory problems. °· Sickness to your stomach or throwing up. °· Tiredness. °· Fussiness. °· Bothered by bright lights or loud noises. °· Anxiousness or depression. °· Restless sleep. °MAKE SURE YOU:  °· Understand these instructions. °· Will watch your condition. °· Will get help right away if you are not doing well or get worse. °  °This information is not intended to replace advice given to you by your health care provider. Make sure you discuss any questions you have with your health care provider. °  °Document Released: 09/08/2008 Document Revised: 10/17/2014 Document Reviewed: 06/03/2013 °Elsevier Interactive Patient Education ©2016 [employer] ° °

## 2016-03-17 ENCOUNTER — Emergency Department: Payer: Medicare Other

## 2016-03-17 ENCOUNTER — Encounter: Payer: Self-pay | Admitting: Emergency Medicine

## 2016-03-17 ENCOUNTER — Emergency Department
Admission: EM | Admit: 2016-03-17 | Discharge: 2016-03-17 | Disposition: A | Discharge status: Home / Self Care | Payer: Medicare Other | Attending: Emergency Medicine | Admitting: Emergency Medicine

## 2016-03-17 DIAGNOSIS — S0083XA Contusion of other part of head, initial encounter: Secondary | ICD-10-CM | POA: Insufficient documentation

## 2016-03-17 DIAGNOSIS — N183 Chronic kidney disease, stage 3 (moderate): Secondary | ICD-10-CM | POA: Insufficient documentation

## 2016-03-17 DIAGNOSIS — Z79899 Other long term (current) drug therapy: Secondary | ICD-10-CM | POA: Diagnosis not present

## 2016-03-17 DIAGNOSIS — I129 Hypertensive chronic kidney disease with stage 1 through stage 4 chronic kidney disease, or unspecified chronic kidney disease: Secondary | ICD-10-CM | POA: Diagnosis not present

## 2016-03-17 DIAGNOSIS — Y939 Activity, unspecified: Secondary | ICD-10-CM | POA: Diagnosis not present

## 2016-03-17 DIAGNOSIS — Y999 Unspecified external cause status: Secondary | ICD-10-CM | POA: Insufficient documentation

## 2016-03-17 DIAGNOSIS — W1839XA Other fall on same level, initial encounter: Secondary | ICD-10-CM | POA: Diagnosis not present

## 2016-03-17 DIAGNOSIS — Y92129 Unspecified place in nursing home as the place of occurrence of the external cause: Secondary | ICD-10-CM | POA: Diagnosis not present

## 2016-03-17 DIAGNOSIS — W19XXXA Unspecified fall, initial encounter: Secondary | ICD-10-CM

## 2016-03-17 DIAGNOSIS — S0990XA Unspecified injury of head, initial encounter: Secondary | ICD-10-CM | POA: Diagnosis present

## 2016-03-17 NOTE — ED Provider Notes (Signed)
[facility] Emergency Department Provider Note   ____________________________________________  Time seen: Seen upon arrival to the emergency department  I have reviewed the triage vital signs and the nursing notes.   HISTORY  Chief Complaint Fall   HPI Gregory Oconnor is a 82 y.o. male with a history of dementia who is presenting to the emergency department today by EMS after a fall at his skilled nursing facility. Per EMS, he bumped into a door and then fell backwards. There was no loss of consciousness per EMS. I also discussed the case with his wife who is also his legal power of attorney who says that he is always severely confused. Patient is not able to offer any further history about the events prior to arrival. Denying any pain at this time.EMS said the patient was able to ambulate with them. Past Medical History  Diagnosis Date  . GI bleed   . Dementia   . Hypertension     Patient Active Problem List   Diagnosis Date Noted  . Pressure ulcer 10/25/2015  . 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 Hypokalemia 10/24/2015  . Malignant essential hypertension 10/24/2015  . CKD (chronic kidney disease) stage 3, GFR 30-59 ml/min 10/24/2015  . Acute posthemorrhagic anemia 10/24/2015  . Rectal bleed 10/23/2015    History reviewed. No pertinent past surgical history.  Current Outpatient Rx  Name  Route  Sig  Dispense  Refill  . amLODipine (NORVASC) 10 MG tablet   Oral   Take 1 tablet (10 mg total) by mouth daily.   30 tablet   6   . Cyanocobalamin (RA VITAMIN B-12 TR) 1000 MCG TBCR   Oral   Take 1,000 mg by mouth daily.         Marland Kitchen docusate (COLACE) 50 MG/5ML liquid   Oral   Take 10 mLs (100 mg total) by mouth 2 (two) times daily.   100 mL   0   . donepezil (ARICEPT) 10 MG tablet   Oral   Take 10 mg by mouth at bedtime.         Marland Kitchen escitalopram (LEXAPRO) 5 MG tablet   Oral   Take 1 tablet  (5 mg total) by mouth daily.   30 tablet   6   . ferrous sulfate 220 (44 Fe) MG/5ML solution   Oral   Take 5 mLs (220 mg total) by mouth daily with breakfast.   150 mL   3   . isosorbide mononitrate (IMDUR) 120 MG 24 hr tablet   Oral   Take 120 mg by mouth daily.         . nitroGLYCERIN (NITROSTAT) 0.4 MG SL tablet   Sublingual   Place 0.4 mg under the tongue every 5 (five) minutes as needed for chest pain.          Marland Kitchen QUEtiapine (SEROQUEL) 25 MG tablet   Oral   Take 1 tablet (25 mg total) by mouth at bedtime.   30 tablet   6   . QUEtiapine (SEROQUEL) 25 MG tablet   Oral   Take 0.5 tablets (12.5 mg total) by mouth 3 (three) times daily as needed (agitation).   90 tablet   6   . senna (SENOKOT) 8.6 MG TABS tablet   Oral   Take 1 tablet (8.6 mg total) by mouth daily as needed for mild constipation.   120 each   0     Allergies  Azithromycin; Cephalosporins; Lisinopril; and Macrolides and ketolides  History reviewed. No pertinent family history.  Social History Social History  Substance Use Topics  . Smoking status: Never Smoker   . Smokeless tobacco: None  . Alcohol Use: No    Review of Systems Caveat 2/2 dementia   ____________________________________________   PHYSICAL EXAM:  VITAL SIGNS: ED Triage Vitals  Enc Vitals Group     BP 03/17/16 2050 149/78 mmHg     Pulse Rate 03/17/16 2050 71     Resp 03/17/16 2050 18     Temp 03/17/16 2050 99 F (37.2 C)     Temp Source 03/17/16 2050 Oral     SpO2 03/17/16 2050 94 %     Weight --      Height --      Head Cir --      Peak Flow --      Pain Score --      Pain Loc --      Pain Edu? --      Excl. in GC? --     Constitutional: Alert. Well appearing and in no acute distress. Eyes: Conjunctivae are normal. PERRL. EOMI. Head: Atraumatic. Nose: No congestion/rhinnorhea. Mouth/Throat: Mucous membranes are moist.   Neck: No stridor.  No tenderness palpation of the cervical spine. Cardiovascular:  Normal rate, regular rhythm. Grossly normal heart sounds.   Respiratory: Normal respiratory effort.  No retractions. Lungs CTAB. Gastrointestinal: Soft and nontender. No distention.  Musculoskeletal: No lower extremity tenderness nor edema.  No joint effusions. Pelvis is stable. No tenderness to the bilateral hips. No deformity on secondary survey or tenderness palpation.  Neurologic:  Normal speech and language. No gross focal neurologic deficits are appreciated.  Skin:  Skin is warm, dry and intact. No rash noted. Psychiatric: Mood and affect are normal.   ____________________________________________   LABS (all labs ordered are listed, but only abnormal results are displayed)  Labs Reviewed - No data to display ____________________________________________  EKG   ____________________________________________  RADIOLOGY   CT Head Wo Contrast (Final result) Result time: 03/17/16 21:22:52   Final result by Rad Results In Interface (03/17/16 21:22:52)   Narrative:   CLINICAL DATA: Recent fall with forehead hematoma and neck pain, initial encounter  EXAM: CT HEAD WITHOUT CONTRAST  CT CERVICAL SPINE WITHOUT CONTRAST  TECHNIQUE: Multidetector CT imaging of the head and cervical spine was performed following the standard protocol without intravenous contrast. Multiplanar CT image reconstructions of the cervical spine were also generated.  COMPARISON: 02/02/2016  FINDINGS: CT HEAD FINDINGS  Bony calvarium is intact. Changes of prior right mastoidectomy are again seen. No gross soft tissue abnormality is seen. Diffuse atrophic changes are again identified as well as chronic white matter ischemic change. No findings to suggest acute hemorrhage, acute infarction or space-occupying mass lesion are noted. Prior small lacunar infarct is noted within the thalamus on the left.  CT CERVICAL SPINE FINDINGS  Seven cervical segments are well visualized. Vertebral body  height is well maintained. Multilevel disc space narrowing with osteophytic changes are noted. Diffuse facet hypertrophic changes are noted bilaterally. No findings to suggest acute fracture or acute facet abnormality are noted. Surrounding soft tissue structures show no acute abnormality.  IMPRESSION: CT of the head: Chronic atrophic and ischemic changes without acute abnormality.  CT of cervical spine: Multilevel degenerative changes without acute abnormality.   Electronically Signed By: Alcide Clever M.D. On: 03/17/2016 21:22          CT Cervical Spine  Wo Contrast (Final result) Result time: 03/17/16 21:22:52   Final result by Rad Results In Interface (03/17/16 21:22:52)   Narrative:   CLINICAL DATA: Recent fall with forehead hematoma and neck pain, initial encounter  EXAM: CT HEAD WITHOUT CONTRAST  CT CERVICAL SPINE WITHOUT CONTRAST  TECHNIQUE: Multidetector CT imaging of the head and cervical spine was performed following the standard protocol without intravenous contrast. Multiplanar CT image reconstructions of the cervical spine were also generated.  COMPARISON: 02/02/2016  FINDINGS: CT HEAD FINDINGS  Bony calvarium is intact. Changes of prior right mastoidectomy are again seen. No gross soft tissue abnormality is seen. Diffuse atrophic changes are again identified as well as chronic white matter ischemic change. No findings to suggest acute hemorrhage, acute infarction or space-occupying mass lesion are noted. Prior small lacunar infarct is noted within the thalamus on the left.  CT CERVICAL SPINE FINDINGS  Seven cervical segments are well visualized. Vertebral body height is well maintained. Multilevel disc space narrowing with osteophytic changes are noted. Diffuse facet hypertrophic changes are noted bilaterally. No findings to suggest acute fracture or acute facet abnormality are noted. Surrounding soft tissue structures show no acute  abnormality.  IMPRESSION: CT of the head: Chronic atrophic and ischemic changes without acute abnormality.  CT of cervical spine: Multilevel degenerative changes without acute abnormality.   Electronically Signed By: Alcide CleverMark Lukens M.D. On: 03/17/2016 21:22    ____________________________________________   PROCEDURES  ____________________________________________   INITIAL IMPRESSION / ASSESSMENT AND PLAN / ED COURSE  Pertinent labs & imaging results that were available during my care of the patient were reviewed by me and considered in my medical decision making (see chart for details).  ----------------------------------------- 9:00 PM on 03/17/2016 -----------------------------------------  Discussed the case with Theodis Satoiane Mulvey, the patient's wife, who agrees with minimal workup with imaging only. Patient appears to be at his baseline mental status. She agrees that if the workup was negative the patient will be discharged back to his skilled nursing facility.  ----------------------------------------- 9:43 PM on 03/17/2016 -----------------------------------------  Patient still resting comfortable. Very reassuring CT workup without any acute findings on the CT of his head or cervical spine. I also went in to passively range his hips and they range smoothly and without any outward signs of pain. We'll be discharging the patient home. His wife requests that we transfer the ambulance.  ____________________________________________   FINAL CLINICAL IMPRESSION(S) / ED DIAGNOSES  Fall.    NEW MEDICATIONS STARTED DURING THIS VISIT:  New Prescriptions   No medications on file     Note:  This document was prepared using Dragon voice recognition software and may include unintentional dictation errors.    Myrna Blazeravid Matthew Schaevitz, MD 03/17/16 2152

## 2016-03-17 NOTE — ED Notes (Signed)
Gregory Oconnor NT sitting with pt for safety.

## 2016-03-17 NOTE — ED Notes (Signed)
[facility] for [facility]. Pt was going to the restroom and ran into a door, falling backward. Hematoma noted on left forehead. No other obvious injuries noted. Pt denies pain at this time. Nursing home facility staff reported that pt not on blood thinner.

## 2016-04-24 ENCOUNTER — Emergency Department
Admission: EM | Admit: 2016-04-24 | Discharge: 2016-04-25 | Disposition: A | Discharge status: Home / Self Care | Payer: Medicare Other | Attending: Emergency Medicine | Admitting: Emergency Medicine

## 2016-04-24 ENCOUNTER — Emergency Department: Payer: Medicare Other

## 2016-04-24 ENCOUNTER — Encounter: Payer: Self-pay | Admitting: Emergency Medicine

## 2016-04-24 DIAGNOSIS — F0281 Dementia in other diseases classified elsewhere with behavioral disturbance: Secondary | ICD-10-CM | POA: Diagnosis not present

## 2016-04-24 DIAGNOSIS — I129 Hypertensive chronic kidney disease with stage 1 through stage 4 chronic kidney disease, or unspecified chronic kidney disease: Secondary | ICD-10-CM | POA: Diagnosis not present

## 2016-04-24 DIAGNOSIS — N183 Chronic kidney disease, stage 3 (moderate): Secondary | ICD-10-CM | POA: Insufficient documentation

## 2016-04-24 DIAGNOSIS — Y9289 Other specified places as the place of occurrence of the external cause: Secondary | ICD-10-CM | POA: Diagnosis not present

## 2016-04-24 DIAGNOSIS — G300 Alzheimer's disease with early onset: Secondary | ICD-10-CM | POA: Diagnosis not present

## 2016-04-24 DIAGNOSIS — S0990XA Unspecified injury of head, initial encounter: Secondary | ICD-10-CM

## 2016-04-24 DIAGNOSIS — Y939 Activity, unspecified: Secondary | ICD-10-CM | POA: Insufficient documentation

## 2016-04-24 DIAGNOSIS — S0081XA Abrasion of other part of head, initial encounter: Secondary | ICD-10-CM | POA: Insufficient documentation

## 2016-04-24 DIAGNOSIS — W19XXXA Unspecified fall, initial encounter: Secondary | ICD-10-CM | POA: Insufficient documentation

## 2016-04-24 DIAGNOSIS — Y999 Unspecified external cause status: Secondary | ICD-10-CM | POA: Insufficient documentation

## 2016-04-24 MED ORDER — BACITRACIN ZINC 500 UNIT/GM EX OINT
TOPICAL_OINTMENT | Freq: Once | CUTANEOUS | Status: AC
  Administered 2016-04-24: 1 via TOPICAL

## 2016-04-24 MED ORDER — BACITRACIN ZINC 500 UNIT/GM EX OINT
TOPICAL_OINTMENT | CUTANEOUS | Status: AC
  Administered 2016-04-24: 1 via TOPICAL
  Filled 2016-04-24: qty 0.9

## 2016-04-24 NOTE — ED Provider Notes (Addendum)
[facility] Emergency Department Provider Note  ____________________________________________   I have reviewed the triage vital signs and the nursing notes.   HISTORY  Chief Complaint Fall and Head Injury    HPI Gregory Oconnor is a 82 y.o. male presents today complaining of a fall. Patient has no recollection of the fall. He is at his baseline according to EMS. Patient has no complaints.Is abrasion to forehead.  Level 5 chart caveat; no further history available due to patient status.   Past Medical History  Diagnosis Date  . GI bleed   . Dementia   . Hypertension     Patient Active Problem List   Diagnosis Date Noted  . Pressure ulcer 10/25/2015  . 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 Hypokalemia 10/24/2015  . Malignant essential hypertension 10/24/2015  . CKD (chronic kidney disease) stage 3, GFR 30-59 ml/min 10/24/2015  . Acute posthemorrhagic anemia 10/24/2015  . Rectal bleed 10/23/2015    History reviewed. No pertinent past surgical history.  Current Outpatient Rx  Name  Route  Sig  Dispense  Refill  . amLODipine (NORVASC) 10 MG tablet   Oral   Take 1 tablet (10 mg total) by mouth daily.   30 tablet   6   . Cyanocobalamin (RA VITAMIN B-12 TR) 1000 MCG TBCR   Oral   Take 1,000 mg by mouth daily.         Marland Kitchen. docusate (COLACE) 50 MG/5ML liquid   Oral   Take 10 mLs (100 mg total) by mouth 2 (two) times daily.   100 mL   0   . donepezil (ARICEPT) 10 MG tablet   Oral   Take 10 mg by mouth at bedtime.         Marland Kitchen. escitalopram (LEXAPRO) 5 MG tablet   Oral   Take 1 tablet (5 mg total) by mouth daily.   30 tablet   6   . ferrous sulfate 220 (44 Fe) MG/5ML solution   Oral   Take 5 mLs (220 mg total) by mouth daily with breakfast.   150 mL   3   . isosorbide mononitrate (IMDUR) 120 MG 24 hr tablet   Oral   Take 120 mg by mouth daily.         . nitroGLYCERIN (NITROSTAT) 0.4  MG SL tablet   Sublingual   Place 0.4 mg under the tongue every 5 (five) minutes as needed for chest pain.          Marland Kitchen. QUEtiapine (SEROQUEL) 25 MG tablet   Oral   Take 1 tablet (25 mg total) by mouth at bedtime.   30 tablet   6   . QUEtiapine (SEROQUEL) 25 MG tablet   Oral   Take 0.5 tablets (12.5 mg total) by mouth 3 (three) times daily as needed (agitation).   90 tablet   6   . senna (SENOKOT) 8.6 MG TABS tablet   Oral   Take 1 tablet (8.6 mg total) by mouth daily as needed for mild constipation.   120 each   0     Allergies Azithromycin; Cephalosporins; Lisinopril; and Macrolides and ketolides  History reviewed. No pertinent family history.  Social History Social History  Substance Use Topics  . Smoking status: Never Smoker   . Smokeless tobacco: None  . Alcohol Use: No    Review of Systems Level 5 chart caveat; no further history available due to patient status.  ____________________________________________  PHYSICAL EXAM:  VITAL SIGNS: ED Triage Vitals  Enc Vitals Group     BP 04/24/16 2018 158/93 mmHg     Pulse Rate 04/24/16 2018 67     Resp 04/24/16 2018 18     Temp 04/24/16 2018 98.1 F (36.7 C)     Temp Source 04/24/16 2018 Oral     SpO2 04/24/16 2018 97 %     Weight 04/24/16 2018 152 lb 8 oz (69.174 kg)     Height 04/24/16 2018 6' (1.829 m)     Head Cir --      Peak Flow --      Pain Score --      Pain Loc --      Pain Edu? --      Excl. in GC? --     Constitutional: Alert and Pleasantly demented. Well appearing and in no acute distress. Eyes: Conjunctivae are normal. PERRL. EOMI. Head: Abrasions noted to forehead, no laceration abrasion noted to nose as well.. Nose: No congestion/rhinnorhea. Mouth/Throat: Mucous membranes are moist.  Oropharynx non-erythematous. Neck: No stridor.   Nontender with no meningismus Cardiovascular: Normal rate, regular rhythm. Grossly normal heart sounds.  Good peripheral circulation. Respiratory: Normal  respiratory effort.  No retractions. Lungs CTAB. Abdominal: Soft and nontender. No distention. No guarding no rebound Back:  There is no focal tenderness or step off there is no midline tenderness there are no lesions noted. there is no CVA tenderness Musculoskeletal: No lower extremity tenderness. No joint effusions, no DVT signs strong distal pulses no edema Neurologic:  Normal speech and language. No gross focal neurologic deficits are appreciated.  Skin:  Skin is warm, dry and intact. No rash noted. Psychiatric: Mood and affect are normal. Speech and behavior are normal.  ____________________________________________   LABS (all labs ordered are listed, but only abnormal results are displayed)  Labs Reviewed - No data to display ____________________________________________  EKG  I personally interpreted any EKGs ordered by me or triage  ____________________________________________  RADIOLOGY  I reviewed any imaging ordered by me or triage that were performed during my shift and, if possible, patient and/or family made aware of any abnormal findings. ____________________________________________   PROCEDURES  Procedure(s) performed: None  Critical Care performed: None  ____________________________________________   INITIAL IMPRESSION / ASSESSMENT AND PLAN / ED COURSE  Pertinent labs & imaging results that were available during my care of the patient were reviewed by me and considered in my medical decision making (see chart for details).  Possibly demented gentleman had a apparently non-syncopal fall, he has no evidence of acute intracranial injury no evidence of hip fracture, at his baseline according to all we can find out. We will discharge. There is nothing to be done for these abrasions besides supportive care which we have instituted.  ----------------------------------------- 11:43 PM on 04/24/2016 -----------------------------------------  Discussed with  family are very comfortable with this plan and discharged a do not wish the patient have excessive blood work they feel he is at his baseline there for not to have further workup. Patient is DO NOT RESUSCITATE and in no acute distress. He himself has no complaints. Family states he has poor balance and "falls all the time". ____________________________________________   FINAL CLINICAL IMPRESSION(S) / ED DIAGNOSES  Final diagnoses:  None      This chart was dictated using voice recognition software.  Despite best efforts to proofread,  errors can occur which can change meaning.     Jeanmarie Plant, MD 04/24/16  1610  Jeanmarie Plant, MD 04/24/16 [id number]

## 2016-04-24 NOTE — ED Notes (Signed)
Family at bedside, pt using his activity apron, no distress noted, cont to monitor

## 2016-04-24 NOTE — ED Notes (Signed)
Pt presents to ED from [facility] via EMS c/o skin tear on head due to a unwitnessed fall. Pt only down for approx 10 minutes.

## 2016-04-24 NOTE — Discharge Instructions (Signed)
Abrasion An abrasion is a cut or scrape on the outer surface of your skin. An abrasion does not extend through all of the layers of your skin. It is important to care for your abrasion properly to prevent infection. CAUSES Most abrasions are caused by falling on or gliding across the ground or another surface. When your skin rubs on something, the outer and inner layer of skin rubs off.  SYMPTOMS A cut or scrape is the main symptom of this condition. The scrape may be bleeding, or it may appear red or pink. If there was an associated fall, there may be an underlying bruise. DIAGNOSIS An abrasion is diagnosed with a physical exam. TREATMENT Treatment for this condition depends on how large and deep the abrasion is. Usually, your abrasion will be cleaned with water and mild soap. This removes any dirt or debris that may be stuck. An antibiotic ointment may be applied to the abrasion to help prevent infection. A bandage (dressing) may be placed on the abrasion to keep it clean. You may also need a tetanus shot. HOME CARE INSTRUCTIONS Medicines  Take or apply medicines only as directed by your health care provider.  If you were prescribed an antibiotic ointment, finish all of it even if you start to feel better. Wound Care  Clean the wound with mild soap and water 2-3 times per day or as directed by your health care provider. Pat your wound dry with a clean towel. Do not rub it.  There are many different ways to close and cover a wound. Follow instructions from your health care provider about:  Wound care.  Dressing changes and removal.  Check your wound every day for signs of infection. Watch for:  Redness, swelling, or pain.  Fluid, blood, or pus. General Instructions  Keep the dressing dry as directed by your health care provider. Do not take baths, swim, use a hot tub, or do anything that would put your wound underwater until your health care provider approves.  If there is  swelling, raise (elevate) the injured area above the level of your heart while you are sitting or lying down.  Keep all follow-up visits as directed by your health care provider. This is important. SEEK MEDICAL CARE IF:  You received a tetanus shot and you have swelling, severe pain, redness, or bleeding at the injection site.  Your pain is not controlled with medicine.  You have increased redness, swelling, or pain at the site of your wound. SEEK IMMEDIATE MEDICAL CARE IF:  You have a red streak going away from your wound.  You have a fever.  You have fluid, blood, or pus coming from your wound.  You notice a bad smell coming from your wound or your dressing.   This information is not intended to replace advice given to you by your health care provider. Make sure you discuss any questions you have with your health care provider.   Document Released: 07/06/2005 Document Revised: 06/17/2015 Document Reviewed: 09/24/2014 Elsevier Interactive Patient Education 2016 [employer]  

## 2016-04-25 NOTE — ED Notes (Signed)
Pt needed to void at discharge, pt didn't want this RN's assistance, pt's son in law able to help patient remove his depends, pt wet it and stated he was done. Pt needs an new depends but refusing to put one on and getting agitated, pt's son in law able to assist as well as other male RN's of the dept

## 2016-05-07 ENCOUNTER — Inpatient Hospital Stay
Admission: EM | Admit: 2016-05-07 | Discharge: 2016-05-09 | DRG: 682 | Disposition: A | Discharge status: Skilled Nursing Facility | Payer: Medicare Other | Attending: Internal Medicine | Admitting: Internal Medicine

## 2016-05-07 ENCOUNTER — Emergency Department: Payer: Medicare Other

## 2016-05-07 DIAGNOSIS — E860 Dehydration: Secondary | ICD-10-CM | POA: Diagnosis present

## 2016-05-07 DIAGNOSIS — I340 Nonrheumatic mitral (valve) insufficiency: Secondary | ICD-10-CM | POA: Diagnosis present

## 2016-05-07 DIAGNOSIS — Z9889 Other specified postprocedural states: Secondary | ICD-10-CM | POA: Diagnosis not present

## 2016-05-07 DIAGNOSIS — N179 Acute kidney failure, unspecified: Secondary | ICD-10-CM | POA: Diagnosis not present

## 2016-05-07 DIAGNOSIS — G9340 Encephalopathy, unspecified: Secondary | ICD-10-CM | POA: Diagnosis present

## 2016-05-07 DIAGNOSIS — R451 Restlessness and agitation: Secondary | ICD-10-CM | POA: Diagnosis present

## 2016-05-07 DIAGNOSIS — Z79899 Other long term (current) drug therapy: Secondary | ICD-10-CM

## 2016-05-07 DIAGNOSIS — F0280 Dementia in other diseases classified elsewhere without behavioral disturbance: Secondary | ICD-10-CM | POA: Diagnosis present

## 2016-05-07 DIAGNOSIS — Z888 Allergy status to other drugs, medicaments and biological substances status: Secondary | ICD-10-CM | POA: Diagnosis not present

## 2016-05-07 DIAGNOSIS — R531 Weakness: Secondary | ICD-10-CM

## 2016-05-07 DIAGNOSIS — G309 Alzheimer's disease, unspecified: Secondary | ICD-10-CM | POA: Diagnosis present

## 2016-05-07 DIAGNOSIS — Z66 Do not resuscitate: Secondary | ICD-10-CM | POA: Diagnosis present

## 2016-05-07 DIAGNOSIS — I10 Essential (primary) hypertension: Secondary | ICD-10-CM | POA: Diagnosis present

## 2016-05-07 HISTORY — DX: Nonrheumatic mitral (valve) insufficiency: I34.0

## 2016-05-07 LAB — URINALYSIS COMPLETE WITH MICROSCOPIC (ARMC ONLY)
BACTERIA UA: NONE SEEN
Bilirubin Urine: NEGATIVE
Glucose, UA: NEGATIVE mg/dL
Hgb urine dipstick: NEGATIVE
Ketones, ur: NEGATIVE mg/dL
Leukocytes, UA: NEGATIVE
Nitrite: NEGATIVE
PROTEIN: 100 mg/dL — AB
SQUAMOUS EPITHELIAL / LPF: NONE SEEN
Specific Gravity, Urine: 1.014 (ref 1.005–1.030)
pH: 6 (ref 5.0–8.0)

## 2016-05-07 LAB — COMPREHENSIVE METABOLIC PANEL
ALBUMIN: 4.1 g/dL (ref 3.5–5.0)
ALK PHOS: 81 U/L (ref 38–126)
ALT: 16 U/L — ABNORMAL LOW (ref 17–63)
ANION GAP: 9 (ref 5–15)
AST: 24 U/L (ref 15–41)
BILIRUBIN TOTAL: 0.4 mg/dL (ref 0.3–1.2)
BUN: 29 mg/dL — AB (ref 6–20)
CALCIUM: 9.2 mg/dL (ref 8.9–10.3)
CO2: 29 mmol/L (ref 22–32)
Chloride: 101 mmol/L (ref 101–111)
Creatinine, Ser: 1.63 mg/dL — ABNORMAL HIGH (ref 0.61–1.24)
GFR calc Af Amer: 41 mL/min — ABNORMAL LOW (ref >60)
GFR calc non Af Amer: 36 mL/min — ABNORMAL LOW (ref >60)
GLUCOSE: 111 mg/dL — AB (ref 65–99)
Potassium: 3.8 mmol/L (ref 3.5–5.1)
Sodium: 139 mmol/L (ref 135–145)
TOTAL PROTEIN: 8 g/dL (ref 6.5–8.1)

## 2016-05-07 LAB — CBC WITH DIFFERENTIAL/PLATELET
BASOS PCT: 1 %
Basophils Absolute: 0.1 10*3/uL (ref 0–0.1)
Eosinophils Absolute: 0.1 10*3/uL (ref 0–0.7)
Eosinophils Relative: 1 %
HEMATOCRIT: 41.2 % (ref 40.0–52.0)
HEMOGLOBIN: 13.9 g/dL (ref 13.0–18.0)
LYMPHS ABS: 1 10*3/uL (ref 1.0–3.6)
LYMPHS PCT: 16 %
MCH: 31 pg (ref 26.0–34.0)
MCHC: 33.7 g/dL (ref 32.0–36.0)
MCV: 92.1 fL (ref 80.0–100.0)
MONOS PCT: 12 %
Monocytes Absolute: 0.7 10*3/uL (ref 0.2–1.0)
NEUTROS ABS: 4.5 10*3/uL (ref 1.4–6.5)
NEUTROS PCT: 70 %
Platelets: 230 10*3/uL (ref 150–440)
RBC: 4.48 MIL/uL (ref 4.40–5.90)
RDW: 16.4 % — ABNORMAL HIGH (ref 11.5–14.5)
WBC: 6.4 10*3/uL (ref 3.8–10.6)

## 2016-05-07 LAB — MAGNESIUM: MAGNESIUM: 2.3 mg/dL (ref 1.7–2.4)

## 2016-05-07 LAB — TROPONIN I: Troponin I: <0.03 ng/mL (ref <0.03)

## 2016-05-07 LAB — LIPASE, BLOOD: Lipase: 39 U/L (ref 11–51)

## 2016-05-07 MED ORDER — ACETAMINOPHEN 650 MG RE SUPP: 650.0000 mg | Freq: Four times a day (QID) | RECTAL | Status: DC | PRN

## 2016-05-07 MED ORDER — ONDANSETRON HCL 4 MG/2ML IJ SOLN: 4.0000 mg | Freq: Four times a day (QID) | INTRAMUSCULAR | Status: DC | PRN

## 2016-05-07 MED ORDER — HALOPERIDOL LACTATE 5 MG/ML IJ SOLN: 1.0000 mg | Freq: Four times a day (QID) | INTRAMUSCULAR | Status: DC | PRN

## 2016-05-07 MED ORDER — QUETIAPINE FUMARATE 25 MG PO TABS: 25.0000 mg | ORAL_TABLET | Freq: Every day | ORAL | Status: DC

## 2016-05-07 MED ORDER — ACETAMINOPHEN 325 MG PO TABS: 650.0000 mg | ORAL_TABLET | Freq: Four times a day (QID) | ORAL | Status: DC | PRN

## 2016-05-07 MED ORDER — ONDANSETRON HCL 4 MG PO TABS
4.0000 mg | ORAL_TABLET | Freq: Four times a day (QID) | ORAL | Status: DC | PRN
Start: 2016-05-07 — End: 2016-05-09

## 2016-05-07 MED ORDER — ENOXAPARIN SODIUM 40 MG/0.4ML ~~LOC~~ SOLN: 40.0000 mg | SUBCUTANEOUS | Status: DC

## 2016-05-07 MED ORDER — AMLODIPINE BESYLATE 5 MG PO TABS
5.0000 mg | ORAL_TABLET | Freq: Every day | ORAL | Status: DC
  Administered 2016-05-07 – 2016-05-09 (×3): 5 mg via ORAL
  Filled 2016-05-07 (×3): qty 1

## 2016-05-07 MED ORDER — SODIUM CHLORIDE 0.9 % IV BOLUS (SEPSIS)
1000.0000 mL | Freq: Once | INTRAVENOUS | Status: AC
  Administered 2016-05-07: 1000 mL via INTRAVENOUS

## 2016-05-07 MED ORDER — ISOSORBIDE MONONITRATE ER 30 MG PO TB24
120.0000 mg | ORAL_TABLET | Freq: Every day | ORAL | Status: DC
  Administered 2016-05-07 – 2016-05-09 (×2): 120 mg via ORAL
  Filled 2016-05-07 (×2): qty 4

## 2016-05-07 MED ORDER — ESCITALOPRAM OXALATE 10 MG PO TABS
5.0000 mg | ORAL_TABLET | Freq: Every day | ORAL | Status: DC
  Administered 2016-05-07 – 2016-05-09 (×3): 5 mg via ORAL
  Filled 2016-05-07 (×2): qty 1
  Filled 2016-05-07: qty 2

## 2016-05-07 MED ORDER — VITAMIN B-12 1000 MCG PO TABS
1000.0000 ug | ORAL_TABLET | Freq: Every day | ORAL | Status: DC
  Administered 2016-05-08 – 2016-05-09 (×2): 1000 ug via ORAL
  Filled 2016-05-07 (×2): qty 1

## 2016-05-07 MED ORDER — HYDRALAZINE HCL 20 MG/ML IJ SOLN
10.0000 mg | Freq: Four times a day (QID) | INTRAMUSCULAR | Status: DC | PRN
  Administered 2016-05-09: 05:00:00 10 mg via INTRAVENOUS
  Filled 2016-05-07: qty 1

## 2016-05-07 MED ORDER — DONEPEZIL HCL 5 MG PO TABS
10.0000 mg | ORAL_TABLET | Freq: Every day | ORAL | Status: DC
  Administered 2016-05-07 – 2016-05-08 (×2): 10 mg via ORAL
  Filled 2016-05-07 (×2): qty 2

## 2016-05-07 MED ORDER — SODIUM CHLORIDE 0.9 % IV SOLN
INTRAVENOUS | Status: DC
  Administered 2016-05-07 – 2016-05-09 (×4): via INTRAVENOUS

## 2016-05-07 MED ORDER — LORAZEPAM 2 MG/ML IJ SOLN
0.5000 mg | Freq: Four times a day (QID) | INTRAMUSCULAR | Status: DC | PRN
  Administered 2016-05-08 – 2016-05-09 (×4): 0.5 mg via INTRAVENOUS
  Filled 2016-05-07 (×4): qty 1

## 2016-05-07 MED ORDER — CYANOCOBALAMIN ER 1000 MCG PO TBCR: 1000000.0000 ug | EXTENDED_RELEASE_TABLET | Freq: Every day | ORAL | Status: DC

## 2016-05-07 NOTE — ED Provider Notes (Signed)
[facility] Emergency Department Provider Note ____________________________________________  Time seen:  I have reviewed the triage vital signs and the triage nursing note.  HISTORY  Chief Complaint Weakness   Historian Patient's step daughter (and wife by phone)  HPI Gregory Oconnor is a 82 y.o. male who was sent in due to generalized weakness and not walking. He typically walks around on his own. Today when the wife and stepdaughter came in he was falling to the left side in his chair and needed help to stand up. It's unclear whether or not this was due to acute weakness or due to cognitive issues. He has not had episodes in the past for cognitive issues have given him issues with weakness or trouble walking, but he is certainly not at his baseline. No reported vomiting or diarrhea or fluid losses. No reported recent illnesses or chest pain or coughing or fevers.  He does have some slurred speech at baseline per the stepdaughter who states that he is at baseline in terms of that. No facial droop.  Symptoms are moderate.    Past Medical History:  Diagnosis Date  . Dementia   . GI bleed   . Hypertension   . MI (mitral incompetence)     Patient Active Problem List   Diagnosis Date Noted  . Pressure ulcer 10/25/2015  . 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 Hypokalemia 10/24/2015  . Malignant essential hypertension 10/24/2015  . CKD (chronic kidney disease) stage 3, GFR 30-59 ml/min 10/24/2015  . Acute posthemorrhagic anemia 10/24/2015  . Rectal bleed 10/23/2015    Past Surgical History:  Procedure Laterality Date  . BYPASS GRAFT    . FETAL SURGERY FOR CONGENITAL HERNIA      Prior to Admission medications   Medication Sig Start Date End Date Taking? Authorizing Provider  amLODipine (NORVASC) 10 MG tablet Take 1 tablet (10 mg total) by mouth daily. 10/24/15   Katharina Caper, MD  Cyanocobalamin (RA  VITAMIN B-12 TR) 1000 MCG TBCR Take 1,000 mg by mouth daily.    Historical Provider, MD  docusate (COLACE) 50 MG/5ML liquid Take 10 mLs (100 mg total) by mouth 2 (two) times daily. 10/26/15   Katharina Caper, MD  donepezil (ARICEPT) 10 MG tablet Take 10 mg by mouth at bedtime.    Historical Provider, MD  escitalopram (LEXAPRO) 5 MG tablet Take 1 tablet (5 mg total) by mouth daily. 10/26/15   Katharina Caper, MD  ferrous sulfate 220 (44 Fe) MG/5ML solution Take 5 mLs (220 mg total) by mouth daily with breakfast. 10/26/15   Katharina Caper, MD  isosorbide mononitrate (IMDUR) 120 MG 24 hr tablet Take 120 mg by mouth daily.    Historical Provider, MD  nitroGLYCERIN (NITROSTAT) 0.4 MG SL tablet Place 0.4 mg under the tongue every 5 (five) minutes as needed for chest pain.     Historical Provider, MD  QUEtiapine (SEROQUEL) 25 MG tablet Take 1 tablet (25 mg total) by mouth at bedtime. 10/24/15   Katharina Caper, MD  QUEtiapine (SEROQUEL) 25 MG tablet Take 0.5 tablets (12.5 mg total) by mouth 3 (three) times daily as needed (agitation). 10/26/15   Katharina Caper, MD  senna (SENOKOT) 8.6 MG TABS tablet Take 1 tablet (8.6 mg total) by mouth daily as needed for mild constipation. 10/26/15   Katharina Caper, MD    Allergies  Allergen Reactions  . Azithromycin Other (See Comments)    unknown  . Cephalosporins Other (See  Comments)    unknown  . Lisinopril Other (See Comments)    unknown  . Macrolides And Ketolides Hives and Other (See Comments)    agitation    No family history on file.  Social History Social History  Substance Use Topics  . Smoking status: Never Smoker  . Smokeless tobacco: Not on file  . Alcohol use No    Review of Systems Limited due to dementia. Constitutional: Negative for fever. Eyes: Negative for visual changes. ENT: Negative for sore throat. Cardiovascular: Negative for chest pain. Respiratory: Negative for shortness of breath. Gastrointestinal: Negative for abdominal pain,  vomiting and diarrhea. Genitourinary: Negative for dysuria. Musculoskeletal: Negative for back pain. Skin: Negative for rash. Neurological: Negative for headache. 10 point Review of Systems otherwise negative ____________________________________________   PHYSICAL EXAM:  VITAL SIGNS: ED Triage Vitals [05/07/16 1814]  Enc Vitals Group     BP (!) 174/86     Pulse Rate 64     Resp 20     Temp 97.9 F (36.6 C)     Temp Source Oral     SpO2 96 %     Weight 155 lb (70.3 kg)     Height 5\' 10"  (1.778 m)     Head Circumference      Peak Flow      Pain Score      Pain Loc      Pain Edu?      Excl. in GC?      Constitutional: Alert and Disoriented, cooperative and follows some commands. Well appearing and in no distress. HEENT   Head: Normocephalic and atraumatic.      Eyes: Conjunctivae are normal. PERRL. Normal extraocular movements.      Ears:         Nose: No congestion/rhinnorhea.   Mouth/Throat: Mucous membranes are moderately dry.   Neck: No stridor. Cardiovascular/Chest: Normal rate, regular rhythm.  No murmurs, rubs, or gallops. Respiratory: Normal respiratory effort without tachypnea nor retractions. Breath sounds are clear and equal bilaterally. No wheezes/rales/rhonchi. Gastrointestinal: Soft. No distention, no guarding, no rebound. Nontender.    Genitourinary/rectal:Deferred Musculoskeletal: He does not have any deformities but he is holding his left arm against his body and holds it tightly thereby try to extend it. Neurologic:  Mild slurred speech, but at baseline per family. No facial drooping. The patient has moves 4 extremities of his own volition, but is really unable to cooperate with normal strength testing. He is not cooperative or able to stand on his own. Skin:  Skin is warm, dry and intact. No rash noted. Psychiatric: No agitation currently   ____________________________________________   EKG I, Governor Rooks, MD, the attending physician have  personally viewed and interpreted all ECGs.  75 bpm. Occasionally paced rhythm. Right bundle branch block with underlying rhythm. Unable to fully assess ST and T-wave due to paced rhythm. ____________________________________________  LABS (pertinent positives/negatives)  Labs Reviewed  CBC WITH DIFFERENTIAL/PLATELET - Abnormal; Notable for the following:       Result Value   RDW 16.4 (*)    All other components within normal limits  COMPREHENSIVE METABOLIC PANEL - Abnormal; Notable for the following:    Glucose, Bld 111 (*)    BUN 29 (*)    Creatinine, Ser 1.63 (*)    ALT 16 (*)    GFR calc non Af Amer 36 (*)    GFR calc Af Amer 41 (*)    All other components within normal limits  URINALYSIS COMPLETEWITH  MICROSCOPIC ([facility] ONLY) - Abnormal; Notable for the following:    Color, Urine YELLOW (*)    APPearance CLEAR (*)    Protein, ur 100 (*)    All other components within normal limits  TROPONIN I  LIPASE, BLOOD  MAGNESIUM    ____________________________________________  RADIOLOGY All Xrays were viewed by me. Imaging interpreted by Radiologist.  CT without contrast:IMPRESSION: 1. No acute intracranial abnormality. No intracranial mass, hemorrhage or edema. 2. Atrophy and chronic ischemic changes, as detailed above. Electronically Signed   By: Bary Richard M.D.   On: 05/07/2016 19:10 __________________________________________  PROCEDURES  Procedure(s) performed: None  Critical Care performed: None  ____________________________________________   ED COURSE / ASSESSMENT AND PLAN  Pertinent labs & imaging results that were available during my care of the patient were reviewed by me and considered in my medical decision making (see chart for details).   Family noted the patient had what sounds like fairly acute onset of generalized weakness. It's unclear to me whether or not this may be cognition related, but he does not have a focal neurologic deficit on exam.  His head CT is negative. He is however uncooperative and somewhat unable to stand up on his all normal really even sit up on his own. I am not sure if it's possible he could have some underlying small stroke which is giving him the problems here. I discussed this with the family and they would prefer to have additional observation admission to the hospital given that he is not at his baseline. He does have mild acute renal failure is given IV fluid bolus in emergency department.  No evidence for infection on urinalysis or by history, without fevers and normal white blood cell count.  Ultimately, he needs observation and is not his baseline he could possibly need an MRI, and possibly physical therapy evaluation.    CONSULTATIONS:  Hospitalist for admission.   Patient / Family / Caregiver informed of clinical course, medical decision-making process, and agree with plan.     ___________________________________________   FINAL CLINICAL IMPRESSION(S) / ED DIAGNOSES   Final diagnoses:  Weakness  Dehydration  Acute renal failure, unspecified acute renal failure type [facility])              Note: This dictation was prepared with Dragon dictation. Any transcriptional errors that result from this process are unintentional    Governor Rooks, MD 05/07/16 2102

## 2016-05-07 NOTE — ED Triage Notes (Signed)
Pt came to ED via EMS from [facility]. Per staff and family, pt reports he has been more weak than normal lately. Staff reports weakness on left side. Seen at ED for fall last week. History of dementia.

## 2016-05-07 NOTE — H&P (Signed)
[facility] [place] at [facility]   PATIENT NAME: Gregory Oconnor    MR#:  [id number]  DATE OF BIRTH:  10-13-1923  DATE OF ADMISSION:  05/07/2016  PRIMARY CARE PHYSICIAN: Mick Sell, MD   REQUESTING/REFERRING PHYSICIAN: Governor Rooks MD  CHIEF COMPLAINT:   Chief Complaint  Patient presents with  . Weakness    HISTORY OF PRESENT ILLNESS: Gregory Oconnor  is a 82 y.o. male with a known history of Dementia, essential hypertension who currently resides in a dementia unit. Patient normally is able to ambulate. But today he was slumped over and therefore was brought to the emergency room. In the emergency room workup revealed him to have a creatinine that was elevated. Patient is very agitated. Trying to climb out of the bed CT scan of the head was negative for stroke. Otherwise evaluation is negative so far. He is unable to provide any history. PAST MEDICAL HISTORY:   Past Medical History:  Diagnosis Date  . Dementia   . GI bleed   . Hypertension   . MI (mitral incompetence)     PAST SURGICAL HISTORY: Past Surgical History:  Procedure Laterality Date  . BYPASS GRAFT    . FETAL SURGERY FOR CONGENITAL HERNIA      SOCIAL HISTORY:  Social History  Substance Use Topics  . Smoking status: Never Smoker  . Smokeless tobacco: Not on file  . Alcohol use No    FAMILY HISTORY: No family history on file.  DRUG ALLERGIES:  Allergies  Allergen Reactions  . Azithromycin Other (See Comments)    unknown  . Cephalosporins Other (See Comments)    unknown  . Lisinopril Other (See Comments)    unknown  . Macrolides And Ketolides Hives and Other (See Comments)    agitation    REVIEW OF SYSTEMS:   CONSTITUTIONAL: Unable to provide his agitated MEDICATIONS AT HOME:  Prior to Admission medications   Medication Sig Start Date End Date Taking? Authorizing Provider  amLODipine (NORVASC) 10 MG tablet Take 1 tablet (10 mg total) by mouth daily.  10/24/15   Katharina Caper, MD  Cyanocobalamin (RA VITAMIN B-12 TR) 1000 MCG TBCR Take 1,000 mg by mouth daily.    Historical Provider, MD  docusate (COLACE) 50 MG/5ML liquid Take 10 mLs (100 mg total) by mouth 2 (two) times daily. 10/26/15   Katharina Caper, MD  donepezil (ARICEPT) 10 MG tablet Take 10 mg by mouth at bedtime.    Historical Provider, MD  escitalopram (LEXAPRO) 5 MG tablet Take 1 tablet (5 mg total) by mouth daily. 10/26/15   Katharina Caper, MD  ferrous sulfate 220 (44 Fe) MG/5ML solution Take 5 mLs (220 mg total) by mouth daily with breakfast. 10/26/15   Katharina Caper, MD  isosorbide mononitrate (IMDUR) 120 MG 24 hr tablet Take 120 mg by mouth daily.    Historical Provider, MD  nitroGLYCERIN (NITROSTAT) 0.4 MG SL tablet Place 0.4 mg under the tongue every 5 (five) minutes as needed for chest pain.     Historical Provider, MD  QUEtiapine (SEROQUEL) 25 MG tablet Take 1 tablet (25 mg total) by mouth at bedtime. 10/24/15   Katharina Caper, MD  QUEtiapine (SEROQUEL) 25 MG tablet Take 0.5 tablets (12.5 mg total) by mouth 3 (three) times daily as needed (agitation). 10/26/15   Katharina Caper, MD  senna (SENOKOT) 8.6 MG TABS tablet Take 1 tablet (8.6 mg total) by mouth daily as needed for mild constipation. 10/26/15   Katharina Caper, MD  PHYSICAL EXAMINATION:   VITAL SIGNS: Blood pressure (!) 166/86, pulse 95, temperature 97.9 F (36.6 C), temperature source Oral, resp. rate 18, height 5\' 10"  (1.778 m), weight 70.3 kg (155 lb), SpO2 98 %.  GENERAL:  82 y.o.-year-old patient lying in the bed Agitated chronic climb out of the bed  EYES: Pupils equal, round, reactive to light and accommodation. No scleral icterus. Marland Kitchen  HEENT: Head atraumatic, normocephalic. Oropharynx and nasopharynx clear.  NECK:  Supple, no jugular venous distention. No thyroid enlargement, no tenderness.  LUNGS: Normal breath sounds bilaterally, no wheezing, rales,rhonchi or crepitation. No use of accessory muscles of respiration.   CARDIOVASCULAR: S1, S2 normal. No murmurs, rubs, or gallops.  ABDOMEN: Soft, nontender, nondistended. Bowel sounds present. No organomegaly or mass.  EXTREMITIES: No pedal edema, cyanosis, or clubbing.  NEUROLOGIC: Agitated not able to follow commands PSYCHIATRIC: The patient is alert and not oriented to place person or timeicial ulcers on his skin on the leg  LABORATORY PANEL:   CBC  Recent Labs Lab 05/07/16 1815  WBC 6.4  HGB 13.9  HCT 41.2  PLT 230  MCV 92.1  MCH 31.0  MCHC 33.7  RDW 16.4*  LYMPHSABS 1.0  MONOABS 0.7  EOSABS 0.1  BASOSABS 0.1   ------------------------------------------------------------------------------------------------------------------  Chemistries   Recent Labs Lab 05/07/16 1815  NA 139  K 3.8  CL 101  CO2 29  GLUCOSE 111*  BUN 29*  CREATININE 1.63*  CALCIUM 9.2  MG 2.3  AST 24  ALT 16*  ALKPHOS 81  BILITOT 0.4   ------------------------------------------------------------------------------------------------------------------ estimated creatinine clearance is 30.5 mL/min (by C-G formula based on SCr of 1.63 mg/dL). ------------------------------------------------------------------------------------------------------------------ No results for input(s): TSH, T4TOTAL, T3FREE, THYROIDAB in the last 72 hours.  Invalid input(s): FREET3   Coagulation profile No results for input(s): INR, PROTIME in the last 168 hours. ------------------------------------------------------------------------------------------------------------------- No results for input(s): DDIMER in the last 72 hours. -------------------------------------------------------------------------------------------------------------------  Cardiac Enzymes  Recent Labs Lab 05/07/16 1815  TROPONINI <0.03   ------------------------------------------------------------------------------------------------------------------ Invalid input(s):  POCBNP  ---------------------------------------------------------------------------------------------------------------  Urinalysis    Component Value Date/Time   COLORURINE YELLOW (A) 05/07/2016 1915   APPEARANCEUR CLEAR (A) 05/07/2016 1915   APPEARANCEUR Hazy 10/01/2014 2151   LABSPEC 1.014 05/07/2016 1915   LABSPEC 1.013 10/01/2014 2151   PHURINE 6.0 05/07/2016 1915   GLUCOSEU NEGATIVE 05/07/2016 1915   GLUCOSEU Negative 10/01/2014 2151   HGBUR NEGATIVE 05/07/2016 1915   BILIRUBINUR NEGATIVE 05/07/2016 1915   BILIRUBINUR Negative 10/01/2014 2151   KETONESUR NEGATIVE 05/07/2016 1915   PROTEINUR 100 (A) 05/07/2016 1915   NITRITE NEGATIVE 05/07/2016 1915   LEUKOCYTESUR NEGATIVE 05/07/2016 1915   LEUKOCYTESUR Negative 10/01/2014 2151     RADIOLOGY: Ct Head Wo Contrast  Result Date: 05/07/2016 CLINICAL DATA:  Increased left-sided weakness.  History of dementia. EXAM: CT HEAD WITHOUT CONTRAST TECHNIQUE: Contiguous axial images were obtained from the base of the skull through the vertex without intravenous contrast. COMPARISON:  Head CT dated 04/24/2016. FINDINGS: Brain: There is marked generalized parenchymal atrophy with commensurate dilatation of the ventricles and sulci, compatible with the Alzheimer's pattern described on previous exam. Chronic small vessel ischemic changes again noted within the periventricular and subcortical white matter regions bilaterally. Small old lacunar infarcts noted within the basal ganglia regions. There is no mass, hemorrhage, edema or other evidence of acute parenchymal abnormality. No extra-axial hemorrhage. Vascular: No hyperdense vessel or unexpected calcification. There are chronic calcified atherosclerotic changes of the large vessels at the skull base. Skull: Negative for fracture  or focal lesion. Sinuses/Orbits: No acute findings. Other: None. IMPRESSION: 1. No acute intracranial abnormality. No intracranial mass, hemorrhage or edema. 2. Atrophy and  chronic ischemic changes, as detailed above. Electronically Signed   By: Bary Richard M.D.   On: 05/07/2016 19:10   EKG: Orders placed or performed during the hospital encounter of 05/07/16  . EKG 12-Lead  . EKG 12-Lead    IMPRESSION AND PLAN: Patient is a 82 year old with dementia being admitted with altered mental status   1. Acute encephalopathy  Suspect due to some dehydration no evidence of infection  If he doesn't improve with hydration may consider MRI of the brain if able to lay flat   2. Hypertension accelerated at this point likely due to his agitation  Continue amlodipine  Add when necessary IV hydralazine   3. Dementia continue Aricept   4.miscellaneous Lovenox for DVT prophylaxis  All the records are reviewed and case discussed with ED provider. Management plans discussed with the patient, family and they are in agreement.  CODE STATUS: patient has a yellow form confirming his DO NOT RESUSCITATE statusCode Status Orders        Start     Ordered   05/07/16 2044  Do not attempt resuscitation (DNR)  Continuous    Question Answer Comment  In the event of cardiac or respiratory ARREST Do not call a "code blue"   In the event of cardiac or respiratory ARREST Do not perform Intubation, CPR, defibrillation or ACLS   In the event of cardiac or respiratory ARREST Use medication by any route, position, wound care, and other measures to relive pain and suffering. May use oxygen, suction and manual treatment of airway obstruction as needed for comfort.      05/07/16 2044    Code Status History    Date Active Date Inactive Code Status Order ID Comments User Context   10/23/2015  2:30 PM 10/26/2015  9:00 PM DNR [id number]  Katha Hamming, MD ED   10/23/2015  2:10 PM 10/23/2015  2:30 PM Full Code [id number]  Katha Hamming, MD ED    Advance Directive Documentation   Flowsheet Row Most Recent Value  Type of Advance Directive  Out of facility DNR (pink MOST or yellow  form)  Pre-existing out of facility DNR order (yellow form or pink MOST form)  No data  "MOST" Form in Place?  No data       TOTAL TIME TAKING CARE OF THIS PATIENT: 55 minutes.    Auburn Bilberry M.D on 05/07/2016 at 8:47 PM  Between 7am to 6pm - Pager - [phone]  After 6pm go to [email or web address] - password [email or web address] [facility]  [facility]  Office  [phone]  CC: Primary care physician; Mick Sell, MD

## 2016-05-07 NOTE — ED Notes (Signed)
Pt transported to Ct.

## 2016-05-08 LAB — BASIC METABOLIC PANEL
ANION GAP: 5 (ref 5–15)
BUN: 23 mg/dL — AB (ref 6–20)
CALCIUM: 8.2 mg/dL — AB (ref 8.9–10.3)
CO2: 27 mmol/L (ref 22–32)
Chloride: 105 mmol/L (ref 101–111)
Creatinine, Ser: 1.42 mg/dL — ABNORMAL HIGH (ref 0.61–1.24)
GFR calc Af Amer: 49 mL/min — ABNORMAL LOW (ref >60)
GFR calc non Af Amer: 42 mL/min — ABNORMAL LOW (ref >60)
GLUCOSE: 86 mg/dL (ref 65–99)
Potassium: 3.1 mmol/L — ABNORMAL LOW (ref 3.5–5.1)
Sodium: 137 mmol/L (ref 135–145)

## 2016-05-08 LAB — CBC
HEMATOCRIT: 36.7 % — AB (ref 40.0–52.0)
Hemoglobin: 12.7 g/dL — ABNORMAL LOW (ref 13.0–18.0)
MCH: 31.4 pg (ref 26.0–34.0)
MCHC: 34.5 g/dL (ref 32.0–36.0)
MCV: 90.9 fL (ref 80.0–100.0)
Platelets: 193 10*3/uL (ref 150–440)
RBC: 4.03 MIL/uL — ABNORMAL LOW (ref 4.40–5.90)
RDW: 16 % — AB (ref 11.5–14.5)
WBC: 7.2 10*3/uL (ref 3.8–10.6)

## 2016-05-08 LAB — MRSA PCR SCREENING: MRSA by PCR: NEGATIVE

## 2016-05-08 MED ORDER — POTASSIUM CHLORIDE CRYS ER 20 MEQ PO TBCR
40.0000 meq | EXTENDED_RELEASE_TABLET | Freq: Once | ORAL | Status: AC
  Administered 2016-05-08: 40 meq via ORAL
  Filled 2016-05-08: qty 2

## 2016-05-08 NOTE — Progress Notes (Signed)
Pt alert and disoriented X's 4, step daughter at bedside. Pt has a history of dementia and is from [employer]. Wife went to visit yesterday and noticed that the patient had increased weakness and confusion. Patient admitted with encephalopathy. IVF infusing per MD order. Patient has a history of falls and is a high fall risk with bed alarm activated. Pt has a scabbed abrasion noted to center of forehead from a previous fall. Patient also has a h/o vasculitis with dressing to BLE, these have healed but ruddy color, scattered abrasions noted. Coccyx red but blanchable. Pt given Ativan for agitation X's 1 this shift, effective.

## 2016-05-08 NOTE — Progress Notes (Signed)
[facility] [place] [facility]   PATIENT NAME: Gregory Oconnor    MRN#:  [id number]  DATE OF BIRTH:  July 28, 1924  SUBJECTIVE:  Hospital Day: 1 day Gregory Oconnor is a 82 y.o. male presenting with Weakness .   Overnight events: No overnight events Interval Events: No complaints, history unreliable given dementia  REVIEW OF SYSTEMS:  Unable to obtain given patient's mental status  DRUG ALLERGIES:   Allergies  Allergen Reactions  . Azithromycin Other (See Comments)    unknown  . Cephalosporins Other (See Comments)    unknown  . Lisinopril Other (See Comments)    unknown  . Macrolides And Ketolides Hives and Other (See Comments)    agitation    VITALS:  Blood pressure (!) 158/80, pulse 70, temperature 98 F (36.7 C), temperature source Oral, resp. rate 18, height  (1.778 m), weight 145 lb 9 oz (66 kg), SpO2 98 %.  PHYSICAL EXAMINATION:   VITAL SIGNS: Vitals:   05/07/16 2349 05/08/16 0504  BP: 126/75 (!) 158/80  Pulse: 91 70  Resp:  18  Temp:  98 F (36.7 C)   GENERAL:82 y.o.male moderate distress given mental status.  HEAD: Normocephalic, atraumatic.  EYES: Pupils equal, round, reactive to light. Unable to assess extraocular muscles given mental status/medical condition. No scleral icterus.  MOUTH: Moist mucosal membrane. Dentition intact. No abscess noted.  EAR, NOSE, THROAT: Clear without exudates. No external lesions.  NECK: Supple. No thyromegaly. No nodules. No JVD.  PULMONARY: Clear to ascultation, without wheeze rails or rhonci. No use of accessory muscles, Good respiratory effort. good air entry bilaterally CHEST: Nontender to palpation.  CARDIOVASCULAR: S1 and S2. Regular rate and rhythm. No murmurs, rubs, or gallops. No edema. Pedal pulses 2+ bilaterally.  GASTROINTESTINAL: Soft, nontender, nondistended. No masses. Positive bowel sounds. No hepatosplenomegaly.  MUSCULOSKELETAL: No swelling, clubbing, or edema.  Range of motion full in all extremities.  NEUROLOGIC: Unable to assess given mental status/medical condition SKIN: No ulceration, lesions, rashes, or cyanosis. Skin warm and dry. Turgor intact.  PSYCHIATRIC:Sleeping but arousable gives inappropriate answers to questions, Unable to assess given mental status/medical condition       LABORATORY PANEL:   CBC  Recent Labs Lab 05/08/16 0444  WBC 7.2  HGB 12.7*  HCT 36.7*  PLT 193   ------------------------------------------------------------------------------------------------------------------  Chemistries   Recent Labs Lab 05/07/16 1815 05/08/16 0444  NA 139 137  K 3.8 3.1*  CL 101 105  CO2 29 27  GLUCOSE 111* 86  BUN 29* 23*  CREATININE 1.63* 1.42*  CALCIUM 9.2 8.2*  MG 2.3  --   AST 24  --   ALT 16*  --   ALKPHOS 81  --   BILITOT 0.4  --    ------------------------------------------------------------------------------------------------------------------  Cardiac Enzymes  Recent Labs Lab 05/07/16 1815  TROPONINI <0.03   ------------------------------------------------------------------------------------------------------------------  RADIOLOGY:  Ct Head Wo Contrast  Result Date: 05/07/2016 CLINICAL DATA:  Increased left-sided weakness.  History of dementia. EXAM: CT HEAD WITHOUT CONTRAST TECHNIQUE: Contiguous axial images were obtained from the base of the skull through the vertex without intravenous contrast. COMPARISON:  Head CT dated 04/24/2016. FINDINGS: Brain: There is marked generalized parenchymal atrophy with commensurate dilatation of the ventricles and sulci, compatible with the Alzheimer's pattern described on previous exam. Chronic small vessel ischemic changes again noted within the periventricular and subcortical white matter regions bilaterally. Small old lacunar infarcts [facility] the basal ganglia regions. There is no mass, hemorrhage, edema or  other evidence of acute parenchymal abnormality.  No extra-axial hemorrhage. Vascular: No hyperdense vessel or unexpected calcification. There are chronic calcified atherosclerotic changes of the large vessels at the skull base. Skull: Negative for fracture or focal lesion. Sinuses/Orbits: No acute findings. Other: None. IMPRESSION: 1. No acute intracranial abnormality. No intracranial mass, hemorrhage or edema. 2. Atrophy and chronic ischemic changes, as detailed above. Electronically Signed   By: Bary Richard M.D.   On: 05/07/2016 19:10   EKG:   Orders placed or performed during the hospital encounter of 05/07/16  . EKG 12-Lead  . EKG 12-Lead  . EKG 12-Lead  . EKG 12-Lead    ASSESSMENT AND PLAN:   Gregory Oconnor is a 82 y.o. male presenting with Weakness . Admitted 05/07/2016 : Day #: 1 day   1. Acute on chronic encephalopathy : In setting of dementia, mild dehydration I suspect he is close to baseline, we'll get physical therapy evaluation at request of family  2. Hypertension essential: Improved Continue amlodipine  Add when necessary IV hydralazine   3. Dementia continue Aricept   Acute kidney injury: IV fluid hydration: Resolved All the records are reviewed and case discussed with Care Management/Social Workerr. Management plans discussed with the patient, family and they are in agreement.  CODE STATUS: dnr TOTAL TIME TAKING CARE OF THIS PATIENT: 28 minutes.   POSSIBLE D/C IN 1DAYS, DEPENDING ON CLINICAL CONDITION.   Lesli Issa,  Mardi Mainland.D on 05/08/2016 at 12:39 PM  Between 7am to 6pm - Pager - [phone]  After 6pm: House Pager: - [phone]  [facility]  Office  [phone]  CC: Primary care physician; Mick Sell, MD

## 2016-05-08 NOTE — Clinical Social Work Note (Signed)
Clinical Social Work Assessment  Patient Details  Name: Gregory Oconnor MRN: [id number] Date of Birth: 1924/06/07  Date of referral:  05/08/16               Reason for consult:  Facility Placement                Permission sought to share information with:  Family Supports Permission granted to share information::  Yes, Release of Information Signed  Name::     Iremide Meseke: [phone] (cell)  Agency::     Relationship::     Contact Information:     Housing/Transportation Living arrangements for the past 2 months:  Assisted Living Facility Source of Information:  Spouse, Facility Patient Interpreter Needed:  None Criminal Activity/Legal Involvement Pertinent to Current Situation/Hospitalization:  No - Comment as needed Significant Relationships:  Adult Children, Spouse Lives with:  Facility Resident Do you feel safe going back to the place where you live?  Yes Need for family participation in patient care:  Yes (Comment)  Care giving concerns: Patient presented to ED from facility post-fall.    Social Worker assessment / plan:  Patient has a hx of dementia and is a LTC resident in the memory care unit at [facility]. Patient alert but not sufficiently oriented for assessment; all information gleaned from family and facility.  According to Trish, nurse at [facility], the patient is confused at baseline but able to ambulate independently with no cane/walker. Patient is baseline incontinent of bladder and bowel and has full feeding assistance.  According to both Trish and the patient's wife (Diane), the patient has had a sudden decline in mobility since the fall and is showing signs of weakness and lethargy atypical from his baseline.   CSW spoke with the patient briefly in the morning at which point the patient was pleasant and alert but confused about place, time and situation.  PT recommendation is for SNF. Patient's wife's preference is [facility].  Patient's wife understands that insurance gives multiple options.  Employment status:  Retired Database administrator PT Recommendations:  Skilled Nursing Facility Information / Referral to community resources:  Skilled Nursing Facility (Family preferrence [facility])  Patient/Family's Response to care:  Patient unable to respond/Family thankful to CSW for assistance.  Patient/Family's Understanding of and Emotional Response to Diagnosis, Current Treatment, and Prognosis:  Patient unable to respond/Family understands change in baseline and need for physical therapy at SNF level.  Emotional Assessment Appearance:  Appears stated age Attitude/Demeanor/Rapport:  Unable to Assess (patient alert but not oriented) Affect (typically observed):  Appropriate (Pleasant) Orientation:    Alcohol / Substance use:  Never Used Psych involvement (Current and /or in the community):  No (Comment)  Discharge Needs  Concerns to be addressed:  Care Coordination Readmission within the last 30 days:  No Current discharge risk:  Cognitively Impaired Barriers to Discharge:  No Barriers Identified   Judi Cong, LCSW 05/08/2016, 2:24 PM

## 2016-05-08 NOTE — NC FL2 (Signed)
[place] MEDICAID FL2 LEVEL OF CARE SCREENING TOOL     IDENTIFICATION  Patient Name: Gregory Oconnor Birthdate: [birth year] Sex: male Admission Date (Current Location): 05/07/2016  [place] Number:  Chiropodist and Address:  [facility], [address] [place], [place] [address]      Provider Number: [id number]  Attending Physician Name and Address:  Wyatt Haste, MD  Relative Name and Phone Number:       Current Level of Care: Hospital Recommended Level of Care: SNF Prior Approval Number:    Date Approved/Denied:   PASRR Number:   [id number]  Discharge Plan: SNF   Current Diagnoses: Patient Active Problem List   Diagnosis Date Noted  . Acute encephalopathy 05/07/2016  . Pressure ulcer 10/25/2015  . 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 Hypokalemia 10/24/2015  . Malignant essential hypertension 10/24/2015  . CKD (chronic kidney disease) stage 3, GFR 30-59 ml/min 10/24/2015  . Acute posthemorrhagic anemia 10/24/2015  . Rectal bleed 10/23/2015    Orientation RESPIRATION BLADDER Height & Weight     Self  Normal Incontinent Weight: 145 lb 9 oz (66 kg) Height:  5\' 10"  (177.8 cm)  BEHAVIORAL SYMPTOMS/MOOD NEUROLOGICAL BOWEL NUTRITION STATUS   (none )  (none ) Incontinent Diet (Diet: DYS 2 )  AMBULATORY STATUS COMMUNICATION OF NEEDS Skin   Extensive Assist Verbally Normal                       Personal Care Assistance Level of Assistance  Bathing, Feeding, Dressing Bathing Assistance: Limited assistance Feeding assistance: Independent Dressing Assistance: Limited assistance     Functional Limitations Info  Sight, Hearing, Speech Sight Info: Adequate Hearing Info: Adequate Speech Info: Adequate    SPECIAL CARE FACTORS FREQUENCY                       Contractures      Additional Factors Info  Code Status, Allergies Code Status Info:  (DNR  ) Allergies Info:  (Azithromycin, Cephalosporins, Lisinopril, Macrolides And Ketolides)           Current Medications (05/08/2016):  This is the current hospital active medication list Current Facility-Administered Medications  Medication Dose Route Frequency Provider Last Rate Last Dose  . 0.9 %  sodium chloride infusion   Intravenous Continuous Auburn Bilberry, MD 100 mL/hr at 05/08/16 0910    . acetaminophen (TYLENOL) tablet 650 mg  650 mg Oral Q6H PRN Auburn Bilberry, MD       Or  . acetaminophen (TYLENOL) suppository 650 mg  650 mg Rectal Q6H PRN Auburn Bilberry, MD      . amLODipine (NORVASC) tablet 5 mg  5 mg Oral Daily Auburn Bilberry, MD   5 mg at 05/08/16 0910  . donepezil (ARICEPT) tablet 10 mg  10 mg Oral QHS Auburn Bilberry, MD   10 mg at 05/07/16 2325  . escitalopram (LEXAPRO) tablet 5 mg  5 mg Oral Daily Auburn Bilberry, MD   5 mg at 05/08/16 0910  . hydrALAZINE (APRESOLINE) injection 10 mg  10 mg Intravenous Q6H PRN Auburn Bilberry, MD      . isosorbide mononitrate (IMDUR) 24 hr tablet 120 mg  120 mg Oral Daily Auburn Bilberry, MD   120 mg at 05/07/16 2324  . LORazepam (ATIVAN) injection 0.5 mg  0.5 mg Intravenous Q6H PRN Oralia Manis, MD   0.5 mg at 05/08/16 0050  .  ondansetron (ZOFRAN) tablet 4 mg  4 mg Oral Q6H PRN Auburn Bilberry, MD       Or  . ondansetron (ZOFRAN) injection 4 mg  4 mg Intravenous Q6H PRN Auburn Bilberry, MD      . vitamin B-12 (CYANOCOBALAMIN) tablet 1,000 mcg  1,000 mcg Oral Daily Oralia Manis, MD   1,000 mcg at 05/08/16 1610     Discharge Medications: Please see discharge summary for a list of discharge medications.  Relevant Imaging Results:  Relevant Lab Results:   Additional Information  (SSN: [id number])  Sample, Darleen Crocker, LCSW

## 2016-05-08 NOTE — Evaluation (Signed)
Physical Therapy Evaluation Patient Details Name: Gregory Oconnor MRN: [id number] DOB: 26-Jan-1924 Today's Date: 05/08/2016   History of Present Illness  82 yo male with onset of acute encephalopathy was admitted and has clear CT of head, has been in rest home memory care unit with baseline dementia.  PMHx:  CKD 3, HTN, rectal bleed  Clinical Impression  Pt was able to assist PT with standing but cannot control standing balance.  His plan is to see if inpt skilled care is available to him, which would be of the most benefit due to his loss of standing/gait independence.  He was completely able to walk before the transition to hospital and will follow acutely to help with the recovery of strength and function toward memory care at home.    Follow Up Recommendations SNF    Equipment Recommendations  None recommended by PT    Recommendations for Other Services Rehab consult     Precautions / Restrictions Precautions Precautions: Fall Restrictions Weight Bearing Restrictions: No      Mobility  Bed Mobility Overal bed mobility: Needs Assistance Bed Mobility: Supine to Sit;Sit to Supine     Supine to sit: Mod assist Sit to supine: Mod assist   General bed mobility comments: pt could not sequence the work and needed dense cues for scooting out to EOB  Transfers Overall transfer level: Needs assistance Equipment used: 1 person hand held assist (IV pole to support)             General transfer comment: mod assist to stand and could not take a step, listing back and to the left  Ambulation/Gait             General Gait Details: attempted but could not take a step  Stairs            Wheelchair Mobility    Modified Rankin (Stroke Patients Only)       Balance Overall balance assessment: History of Falls;Needs assistance Sitting-balance support: Feet supported Sitting balance-Leahy Scale: Fair   Postural control: Posterior lean Standing balance  support: Bilateral upper extremity supported Standing balance-Leahy Scale: Poor                               Pertinent Vitals/Pain Pain Assessment: No/denies pain    Home Living Family/patient expects to be discharged to:: Assisted living               Home Equipment: Gilmer Mor - single point      Prior Function Level of Independence: Needs assistance   Gait / Transfers Assistance Needed: independent with no AD  ADL's / Homemaking Assistance Needed: staff assist to do ADL's        Hand Dominance        Extremity/Trunk Assessment   Upper Extremity Assessment: Generalized weakness (L side weaker than R side, lags with any wbing task)           Lower Extremity Assessment: Generalized weakness (LLE weaker and less supportive)      Cervical / Trunk Assessment: Kyphotic  Communication   Communication: Expressive difficulties  Cognition Arousal/Alertness: Lethargic Behavior During Therapy: Flat affect Overall Cognitive Status: Impaired/Different from baseline Area of Impairment: Following commands;Problem solving;Awareness;Safety/judgement     Memory: Decreased recall of precautions;Decreased short-term memory Following Commands: Follows one step commands inconsistently Safety/Judgement: Decreased awareness of safety;Decreased awareness of deficits Awareness: Intellectual Problem Solving: Slow processing;Decreased initiation;Difficulty sequencing;Requires verbal cues;Requires tactile  cues      General Comments General comments (skin integrity, edema, etc.): Pt is not moving as at PLOF, has difficulty to control standing with BUE support, as he cannot use L side effectively    Exercises        Assessment/Plan    PT Assessment Patient needs continued PT services  PT Diagnosis Generalized weakness;Hemiplegia non-dominant side;Other (comment) (CT was not indicating a stroke change had occurred)   PT Problem List Decreased strength;Decreased  range of motion;Decreased activity tolerance;Decreased balance;Decreased mobility;Decreased coordination;Decreased cognition;Decreased knowledge of use of DME;Decreased safety awareness;Decreased knowledge of precautions;Decreased skin integrity  PT Treatment Interventions DME instruction;Stair training;Gait training;Functional mobility training;Therapeutic activities;Therapeutic exercise;Balance training;Neuromuscular re-education;Patient/family education   PT Goals (Current goals can be found in the Care Plan section) Acute Rehab PT Goals Patient Stated Goal: none stated PT Goal Formulation: With family Time For Goal Achievement: 05/22/16 Potential to Achieve Goals: Good    Frequency Min 2X/week   Barriers to discharge Other (comment) (at memory unit was independent to walk)      Co-evaluation               End of Session Equipment Utilized During Treatment: Gait belt Activity Tolerance: Patient limited by fatigue;Patient limited by lethargy;Other (comment) (L side weaker than R) Patient left: in bed;with call bell/phone within reach;with bed alarm set;with family/visitor present Nurse Communication: Mobility status         Time: 6606-3016 PT Time Calculation (min) (ACUTE ONLY): 21 min   Charges:   PT Evaluation $PT Eval Moderate Complexity: 1 Procedure     PT G CodesIvar Drape May 24, 2016, 1:23 PM    Samul Dada, PT MS Acute Rehab Dept. Number: [facility] [id number] and [facility] [phone]

## 2016-05-09 LAB — MAGNESIUM: MAGNESIUM: 1.7 mg/dL (ref 1.7–2.4)

## 2016-05-09 MED ORDER — AMLODIPINE BESYLATE 5 MG PO TABS: 5.0000 mg | ORAL_TABLET | Freq: Every day | ORAL | 0 refills | Status: AC

## 2016-05-09 MED ORDER — POTASSIUM CHLORIDE CRYS ER 20 MEQ PO TBCR
40.0000 meq | EXTENDED_RELEASE_TABLET | Freq: Once | ORAL | Status: AC
  Administered 2016-05-09: 08:00:00 40 meq via ORAL
  Filled 2016-05-09: qty 2

## 2016-05-09 NOTE — Progress Notes (Signed)
Patient's family preferences [facility]. CSW contacted [place]- Admissions at [facility] Oconnor and requested she reviewed patient's referral . CSW will continue to follow and assist.  [name], MSW, LCSW, LCAS-A Clinical Social Worker [phone]

## 2016-05-09 NOTE — Progress Notes (Signed)
CSW presented bed offers to patient's wife over the phone. Stated she'd like to think about her decision. Requested CSW speak to [facility]. CSW contacated [facility] and spoke to Ty. Per Ty they are not able to accept patient back at this time due to him no being at baseline. Per Ty patient is independent at [facility]. Reported that he walks independently and is able to complete ADLs. CSW informed patient's wife. Reported that she will accept bed offer at [facility]). Requested EMS transport. CSW contacted Christiane HaConservator, museum/gallery at [facility] and informed him of above.   [name], MSW, LCSW, LCAS-A Clinical Social Worker [phone]

## 2016-05-09 NOTE — Care Management Important Message (Signed)
Important Message  Patient Details  Name: Gregory Oconnor MRN: [id number] Date of Birth: July 21, 1924   Medicare Important Message Given:  Yes    Gwenette Greet, RN 05/09/2016, 9:52 AM

## 2016-05-09 NOTE — Clinical Social Work Placement (Signed)
   CLINICAL SOCIAL WORK PLACEMENT  NOTE  Date:  05/09/2016  Patient Details  Name: Gregory Oconnor MRN: [id number] Date of Birth: 02/14/1924  Clinical Social Work is seeking post-discharge placement for this patient at the Skilled  Nursing Facility level of care (*CSW will initial, date and re-position this form in  chart as items are completed):  Yes   Patient/family provided with [place] Clinical Social Work Department's list of facilities offering this level of care within the geographic area requested by the patient (or if unable, by the patient's family).  Yes   Patient/family informed of their freedom to choose among providers that offer the needed level of care, that participate in Medicare, Medicaid or managed care program needed by the patient, have an available bed and are willing to accept the patient.  Yes   Patient/family informed of [facility]'s ownership interest in [facility] and [facility], as well as of the fact that they are under no obligation to receive care at these facilities.  PASRR submitted to EDS on 05/09/16     PASRR number received on 05/09/16     Existing PASRR number confirmed on       FL2 transmitted to all facilities in geographic area requested by pt/family on 05/09/16     FL2 transmitted to all facilities within larger geographic area on       Patient informed that his/her managed care company has contracts with or will negotiate with certain facilities, including the following:        Yes   Patient/family informed of bed offers received.  Patient chooses bed at  [facility])     Physician recommends and patient chooses bed at      Patient to be transferred to  [facility]) on 05/09/16.  Patient to be transferred to facility by  (EMS)     Patient family notified on 05/09/16 of transfer.  Name of family member notified:   (Wife)     PHYSICIAN       Additional Comment:     _______________________________________________ Idamae Lusher, LCSW 05/09/2016, 3:23 PM

## 2016-05-09 NOTE — Discharge Summary (Signed)
[employer] - [place] at [facility]   PATIENT NAME: Gregory Oconnor    MR#:  [id number]  DATE OF BIRTH:  08-22-1924  DATE OF ADMISSION:  05/07/2016 ADMITTING PHYSICIAN: Wyatt Haste, MD  DATE OF DISCHARGE: 05/09/16  PRIMARY CARE PHYSICIAN: Mick Sell, MD    ADMISSION DIAGNOSIS:  Dehydration [E86.0] Weakness [R53.1] Acute renal failure, unspecified acute renal failure type (HCC) [N17.9]  DISCHARGE DIAGNOSIS:  AKI - resolved Weakness Acute on chronic encephalopathy  SECONDARY DIAGNOSIS:   Past Medical History:  Diagnosis Date  . Dementia   . GI bleed   . Hypertension   . MI (mitral incompetence)     HOSPITAL COURSE:  Gregory Oconnor  is a 82 y.o. male admitted 05/07/2016 with chief complaint Weakness . Please see H&P performed by Wyatt Haste, MD for further information. Patient presented with weakness and confusion increased from baseline. Noted to have mild AKI on admission. He received Iv fluid hydration with improvement of renal function.   DISCHARGE CONDITIONS:   stable  CONSULTS OBTAINED:    DRUG ALLERGIES:   Allergies  Allergen Reactions  . Azithromycin Other (See Comments)    unknown  . Cephalosporins Other (See Comments)    unknown  . Lisinopril Other (See Comments)    unknown  . Macrolides And Ketolides Hives and Other (See Comments)    agitation    DISCHARGE MEDICATIONS:   Current Discharge Medication List    CONTINUE these medications which have CHANGED   Details  amLODipine (NORVASC) 5 MG tablet Take 1 tablet (5 mg total) by mouth daily. Qty: 30 tablet, Refills: 0      CONTINUE these medications which have NOT CHANGED   Details  acetaminophen (TYLENOL) 500 MG tablet Take 500 mg by mouth every 6 (six) hours as needed for mild pain, moderate pain or fever.    Cyanocobalamin (RA VITAMIN B-12 TR) 1000 MCG TBCR Take 1,000 mg by mouth daily.    donepezil (ARICEPT) 10 MG tablet Take 10 mg by mouth at  bedtime.    escitalopram (LEXAPRO) 5 MG tablet Take 1 tablet (5 mg total) by mouth daily. Qty: 30 tablet, Refills: 6    isosorbide mononitrate (IMDUR) 120 MG 24 hr tablet Take 120 mg by mouth daily.    loperamide (IMODIUM) 2 MG capsule Take 2 mg by mouth as needed for diarrhea or loose stools (once daily as needed for diarrhea.).    nitroGLYCERIN (NITROSTAT) 0.4 MG SL tablet Place 0.4 mg under the tongue every 5 (five) minutes as needed for chest pain.     !! QUEtiapine (SEROQUEL) 25 MG tablet Take 1 tablet (25 mg total) by mouth at bedtime. Qty: 30 tablet, Refills: 6    polyethylene glycol (MIRALAX / GLYCOLAX) packet Take 17 g by mouth daily.    !! QUEtiapine (SEROQUEL) 25 MG tablet Take 0.5 tablets (12.5 mg total) by mouth 3 (three) times daily as needed (agitation). Qty: 90 tablet, Refills: 6     !! - Potential duplicate medications found. Please discuss with provider.    STOP taking these medications     docusate (COLACE) 50 MG/5ML liquid      ferrous sulfate 220 (44 Fe) MG/5ML solution      senna (SENOKOT) 8.6 MG TABS tablet          DISCHARGE INSTRUCTIONS:    DIET:  Cardiac diet  DISCHARGE CONDITION:  Stable  ACTIVITY:  Activity as tolerated  OXYGEN:  Home Oxygen: No.  Oxygen Delivery: room air  DISCHARGE LOCATION:  nursing home   If you experience worsening of your admission symptoms, develop shortness of breath, life threatening emergency, suicidal or homicidal thoughts you must seek medical attention immediately by calling 911 or calling your MD immediately  if symptoms less severe.  You Must read complete instructions/literature along with all the possible adverse reactions/side effects for all the Medicines you take and that have been prescribed to you. Take any new Medicines after you have completely understood and accpet all the possible adverse reactions/side effects.   Please note  You were cared for by a hospitalist during your hospital  stay. If you have any questions about your discharge medications or the care you received while you were in the hospital after you are discharged, you can call the unit and asked to speak with the hospitalist on call if the hospitalist that took care of you is not available. Once you are discharged, your primary care physician will handle any further medical issues. Please note that NO REFILLS for any discharge medications will be authorized once you are discharged, as it is imperative that you return to your primary care physician (or establish a relationship with a primary care physician if you do not have one) for your aftercare needs so that they can reassess your need for medications and monitor your lab values.    On the day of Discharge:   VITAL SIGNS:  Blood pressure (!) 142/76, pulse 61, temperature 97.8 F (36.6 C), temperature source Oral, resp. rate 14, height 5\' 10"  (1.778 m), weight 145 lb 9 oz (66 kg), SpO2 97 %.  I/O:   Intake/Output Summary (Last 24 hours) at 05/09/16 1025 Last data filed at 05/08/16 1200  Gross per 24 hour  Intake                0 ml  Output                0 ml  Net                0 ml    PHYSICAL EXAMINATION:  GENERAL:  82 y.o.-year-old patient lying in the bed with no acute distress.  EYES: Pupils equal, round, reactive to light and accommodation. No scleral icterus. Extraocular muscles intact.  HEENT: Head atraumatic, normocephalic. Oropharynx and nasopharynx clear.  NECK:  Supple, no jugular venous distention. No thyroid enlargement, no tenderness.  LUNGS: Normal breath sounds bilaterally, no wheezing, rales,rhonchi or crepitation. No use of accessory muscles of respiration.  CARDIOVASCULAR: S1, S2 normal. No murmurs, rubs, or gallops.  ABDOMEN: Soft, non-tender, non-distended. Bowel sounds present. No organomegaly or mass.  EXTREMITIES: No pedal edema, cyanosis, or clubbing.  NEUROLOGIC: Cranial nerves II through XII are intact. Muscle strength  5/5 in all extremities. Sensation intact. Gait not checked.  PSYCHIATRIC: awake, pleasantly confused  SKIN: No obvious rash, lesion, or ulcer.   DATA REVIEW:   CBC  Recent Labs Lab 05/08/16 0444  WBC 7.2  HGB 12.7*  HCT 36.7*  PLT 193    Chemistries   Recent Labs Lab 05/07/16 1815 05/08/16 0444 05/09/16 0807  NA 139 137  --   K 3.8 3.1*  --   CL 101 105  --   CO2 29 27  --   GLUCOSE 111* 86  --   BUN 29* 23*  --   CREATININE 1.63* 1.42*  --   CALCIUM 9.2 8.2*  --   MG 2.3  --  1.7  AST 24  --   --   ALT 16*  --   --   ALKPHOS 81  --   --   BILITOT 0.4  --   --     Cardiac Enzymes  Recent Labs Lab 05/07/16 1815  TROPONINI <0.03    Microbiology Results  Results for orders placed or performed during the hospital encounter of 05/07/16  MRSA PCR Screening     Status: None   Collection Time: 05/08/16  1:59 AM  Result Value Ref Range Status   MRSA by PCR NEGATIVE NEGATIVE Final    Comment:        The GeneXpert MRSA Assay (FDA approved for NASAL specimens only), is one component of a comprehensive MRSA colonization surveillance program. It is not intended to diagnose MRSA infection nor to guide or monitor treatment for MRSA infections.     RADIOLOGY:  Ct Head Wo Contrast  Result Date: 05/07/2016 CLINICAL DATA:  Increased left-sided weakness.  History of dementia. EXAM: CT HEAD WITHOUT CONTRAST TECHNIQUE: Contiguous axial images were obtained from the base of the skull through the vertex without intravenous contrast. COMPARISON:  Head CT dated 04/24/2016. FINDINGS: Brain: There is marked generalized parenchymal atrophy with commensurate dilatation of the ventricles and sulci, compatible with the Alzheimer's pattern described on previous exam. Chronic small vessel ischemic changes again noted within the periventricular and subcortical white matter regions bilaterally. Small old lacunar infarcts noted within the basal ganglia regions. There is no mass,  hemorrhage, edema or other evidence of acute parenchymal abnormality. No extra-axial hemorrhage. Vascular: No hyperdense vessel or unexpected calcification. There are chronic calcified atherosclerotic changes of the large vessels at the skull base. Skull: Negative for fracture or focal lesion. Sinuses/Orbits: No acute findings. Other: None. IMPRESSION: 1. No acute intracranial abnormality. No intracranial mass, hemorrhage or edema. 2. Atrophy and chronic ischemic changes, as detailed above. Electronically Signed   By: Bary Richard M.D.   On: 05/07/2016 19:10    Management plans discussed with the patient, family and they are in agreement.  CODE STATUS:     Code Status Orders        Start     Ordered   05/07/16 2044  Do not attempt resuscitation (DNR)  Continuous    Question Answer Comment  In the event of cardiac or respiratory ARREST Do not call a "code blue"   In the event of cardiac or respiratory ARREST Do not perform Intubation, CPR, defibrillation or ACLS   In the event of cardiac or respiratory ARREST Use medication by any route, position, wound care, and other measures to relive pain and suffering. May use oxygen, suction and manual treatment of airway obstruction as needed for comfort.      05/07/16 2044    Code Status History    Date Active Date Inactive Code Status Order ID Comments User Context   10/23/2015  2:30 PM 10/26/2015  9:00 PM DNR [id number]  Katha Hamming, MD ED   10/23/2015  2:10 PM 10/23/2015  2:30 PM Full Code [id number]  Katha Hamming, MD ED    Advance Directive Documentation   Flowsheet Row Most Recent Value  Type of Advance Directive  Out of facility DNR (pink MOST or yellow form)  Pre-existing out of facility DNR order (yellow form or pink MOST form)  Yellow form placed in chart (order not valid for inpatient use)  "MOST" Form in Place?  No data      TOTAL TIME TAKING CARE  OF THIS PATIENT: 32 minutes.    Random Dobrowski,  Mardi Mainland.D on 05/09/2016 at  10:25 AM  Between 7am to 6pm - Pager - [phone]  After 6pm go to [email or web address] - Scientist, research (life sciences) [facility]  Office  [phone]  CC: Primary care physician; Mick Sell, MD

## 2016-05-09 NOTE — Progress Notes (Signed)
Pt d/c to [facility] via stretcher by EMS

## 2016-05-09 NOTE — Progress Notes (Signed)
Report called to Santina Evans RN at [facility] pt to be transferred today, EMS called.

## 2016-05-09 NOTE — Progress Notes (Signed)
Clinical Social Worker was informed that patient will be medically ready to discharge to [facility]. Patient's wife is in a agreement with plan. CSW called Christiane Ha- Production designer, theatre/television/film at [facility] to confirm that patient's bed is ready. Provided patient's room number 11A and number to call for report [phone] . All discharge information faxed to [facility] via [facility]. DNR added to discharge packet.   Call to patient's wife, informed her patient would discharge to [facility]. RN will call report and patient will discharge to [facility] via EMS  [name], MSW, LCSW, LCAS-A Clinical Social Worker [phone]

## 2016-05-09 NOTE — Plan of Care (Signed)
Problem: Education: Goal: Knowledge of [facility] Education information/materials will improve Outcome: Not Met (add Reason) No family present during the night. Pt with history of dementia. Disoriented x 4. Incomprehensible speech.   Problem: Safety: Goal: Ability to remain free from injury will improve Outcome: Not Met (add Reason) Pt from [facility] unit. Earlier in the night around midnight pt impulsive. Bed alarm sounded off often. Ativan given once for agitation (pt combative, kicking at staff. Unable to redirect). Pt pulled out his peripheral iv (Mitts placed to prevent patient from pulling out lines). Soft music played to possibly aid in relaxation. Currently pt resting quietly.

## 2016-05-09 NOTE — Progress Notes (Signed)
CSW spoke to patient's wife- [facility] [phone] and informed her that [facility] was not able to meet patient's needs at this time. New preference [facility], [facility]. CSW contacted each facility and requested they review his referral to determine if they can meet his needs. CSW will continue to follow and assist.  [name], MSW, LCSW, LCAS-A Clinical Social Worker [phone]

## 2016-06-10 DEATH — deceased

## 2017-07-01 IMAGING — CT CT HEAD W/O CM
3 of 4 series · 15 of 47 positions shown, 18 images · non-contrast
Comparison: Head CT dated 04/24/2016.

CLINICAL DATA: Increased left-sided weakness.  History of dementia.

EXAM:
CT HEAD WITHOUT CONTRAST
TECHNIQUE: Contiguous axial images were obtained from the base of the skull
through the vertex without intravenous contrast.

[Series 2: head wo · axial · 0.58mm/px · z∈[+151,+286]mm · 9 of 35 slices shown, 12 images]
[im 4/35  brain]
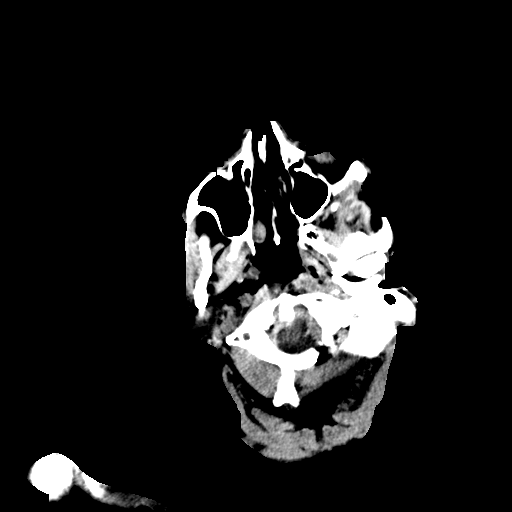
[im 4/35  bone]
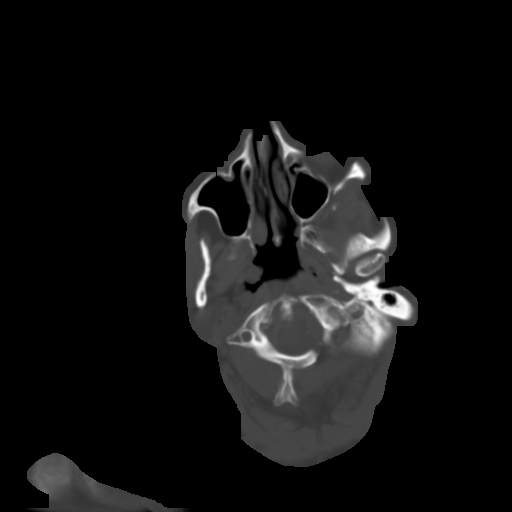
[im 7/35  brain]
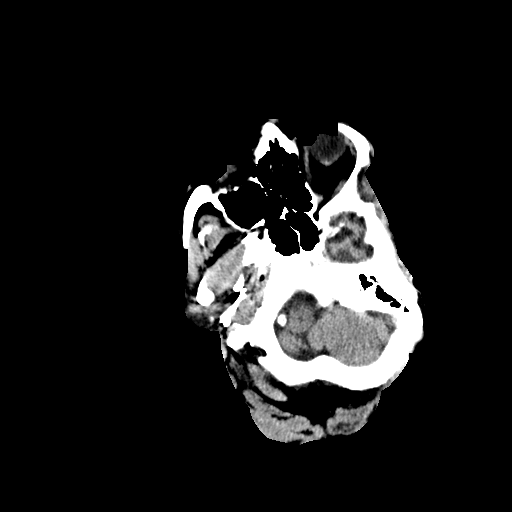
[im 10/35  brain]
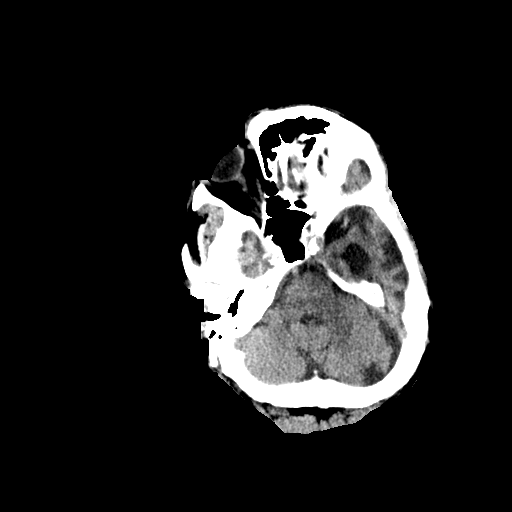
[im 13/35  brain]
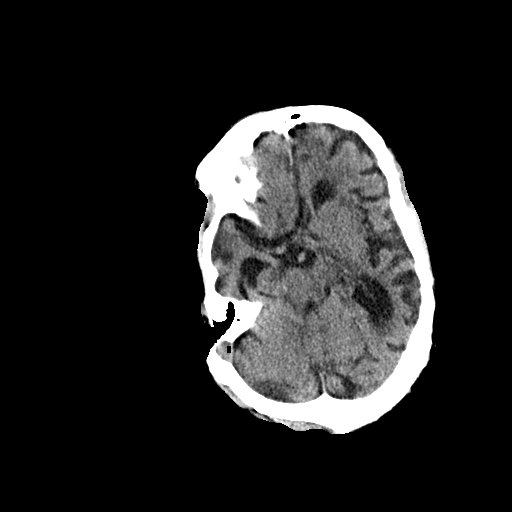
[im 19/35  brain]
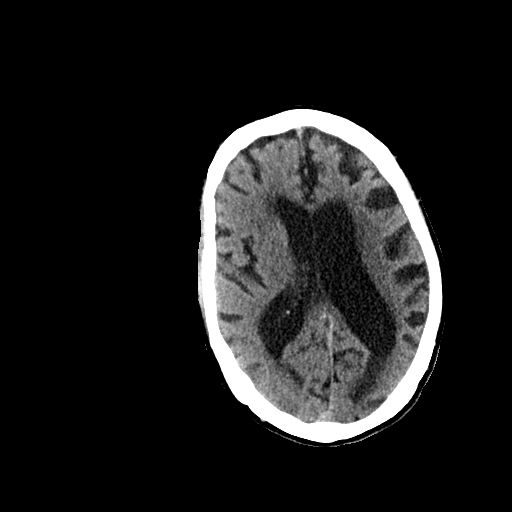
[im 19/35  bone]
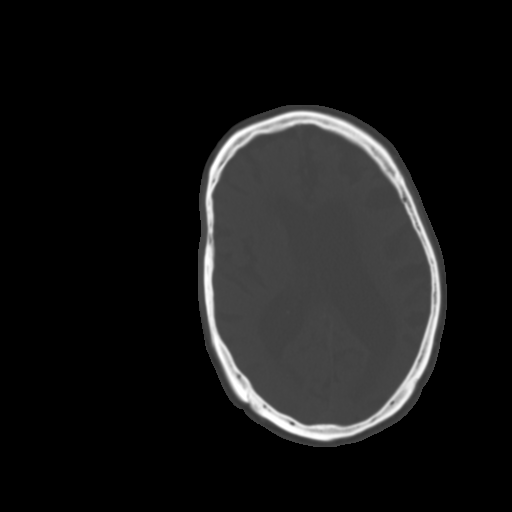
[im 22/35  brain]
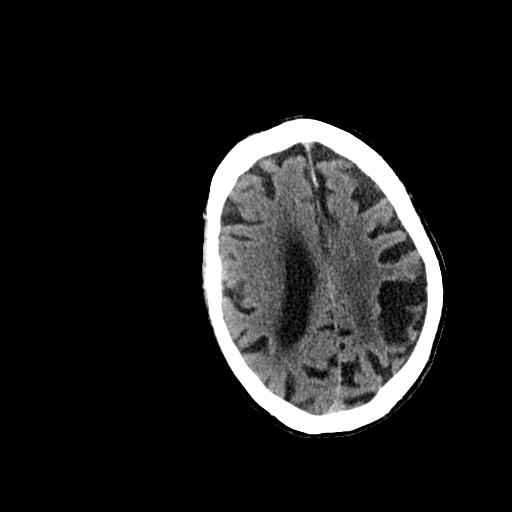
[im 25/35  brain]
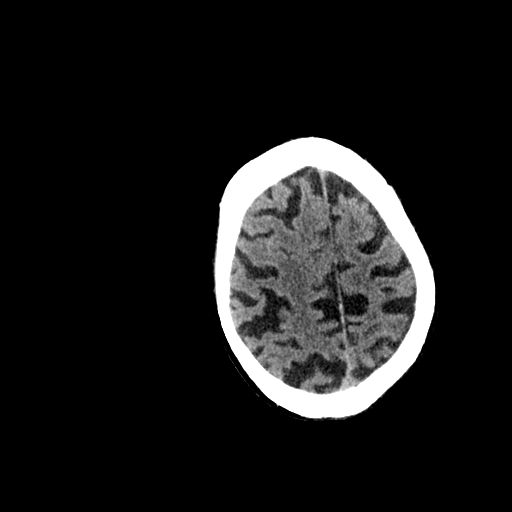
[im 28/35  brain]
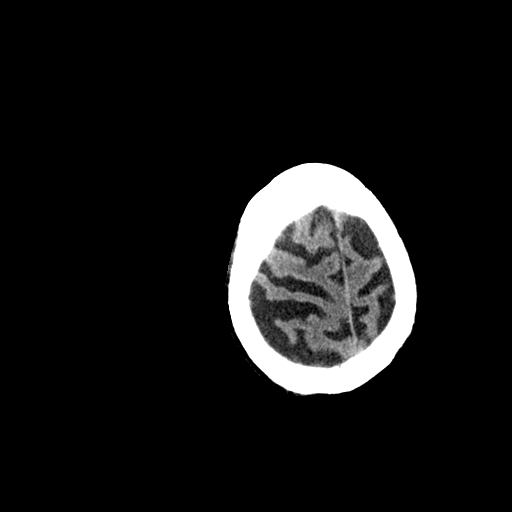
[im 31/35  brain]
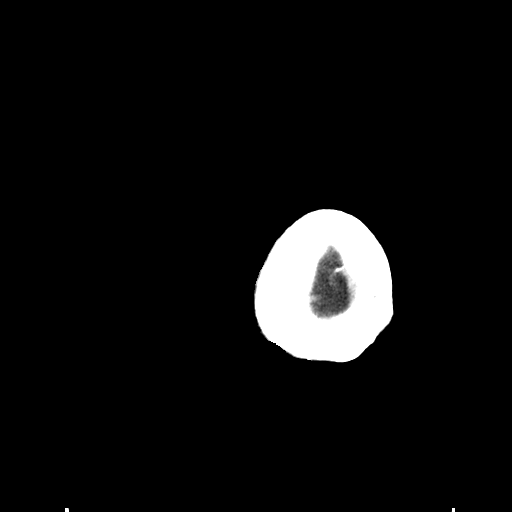
[im 31/35  bone]
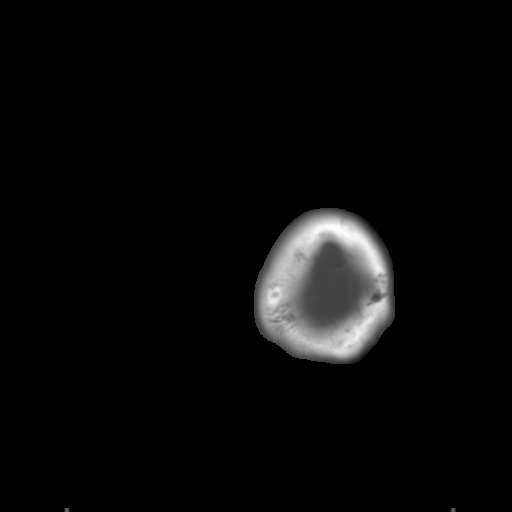

[Series 4: coronal soft tissue · coronal · 0.29mm/px · 3 of 67 slices shown]
[im 23/67  brain]
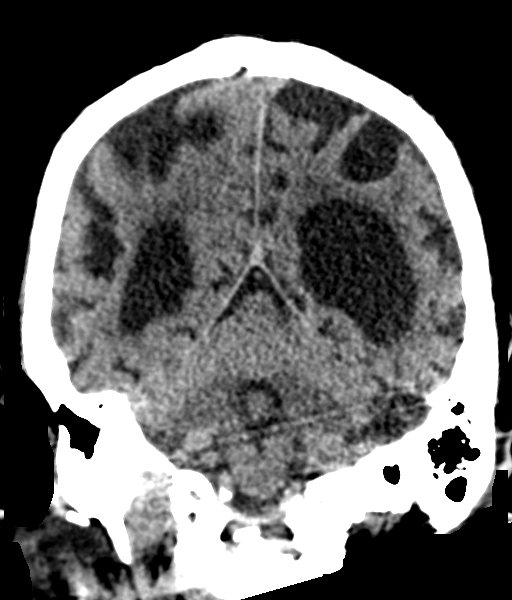
[im 30/67  brain]
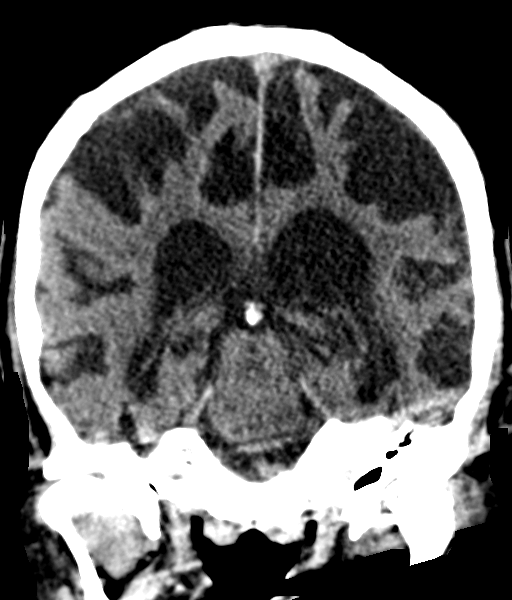
[im 37/67  brain]
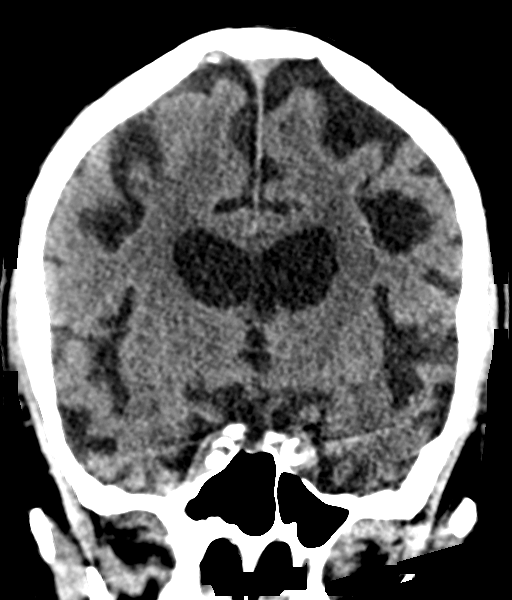

[Series 5: sagittal soft tissue · sagittal · 0.33mm/px · 3 of 50 slices shown]
[im 21/50  brain]
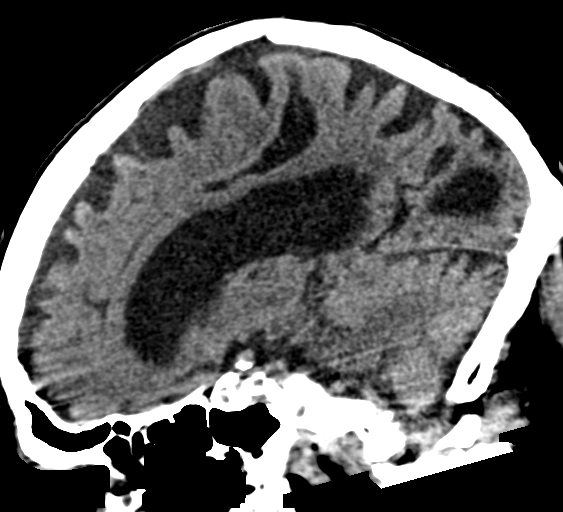
[im 25/50  brain]
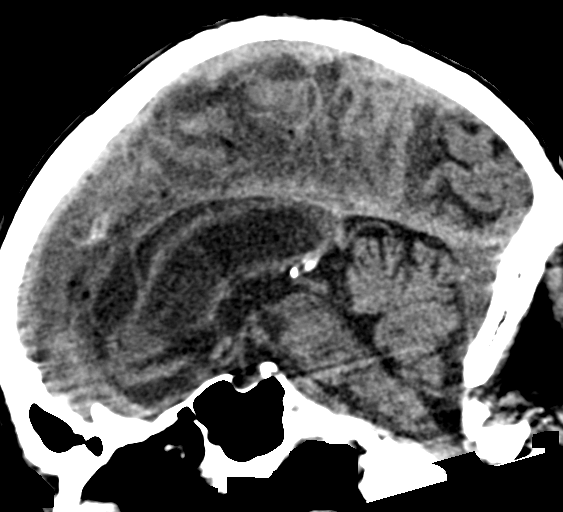
[im 29/50  brain]
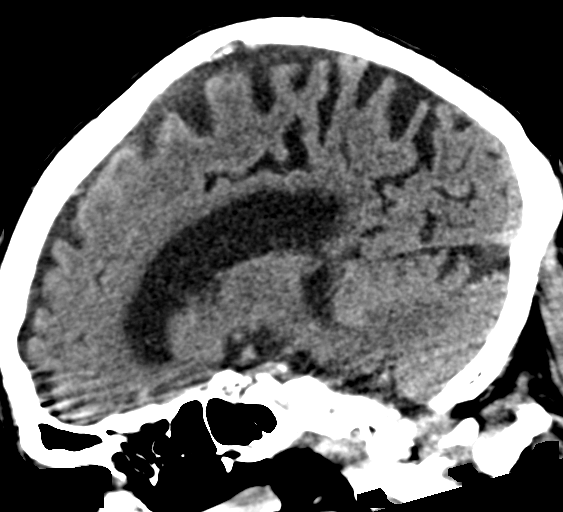

[15 of 47 positions shown; findings below may reference images not displayed]

FINDINGS: Brain: There is marked generalized parenchymal atrophy with
commensurate dilatation of the ventricles and sulci, compatible with
the Alzheimer's pattern described on previous exam. Chronic small
vessel ischemic changes again noted within the periventricular and
subcortical white matter regions bilaterally. Small old lacunar
infarcts noted within the basal ganglia regions.

There is no mass, hemorrhage, edema or other evidence of acute
parenchymal abnormality. No extra-axial hemorrhage.

Vascular: No hyperdense vessel or unexpected calcification. There
are chronic calcified atherosclerotic changes of the large vessels
at the skull base.

Skull: Negative for fracture or focal lesion.

Sinuses/Orbits: No acute findings.

Other: None.
IMPRESSION: 1. No acute intracranial abnormality. No intracranial mass,
hemorrhage or edema.
2. Atrophy and chronic ischemic changes, as detailed above.
